# Patient Record
Sex: Male | Born: 1960 | Race: White | Hispanic: No | Marital: Married | Smoking: Never smoker
Health system: Southern US, Community
[De-identification: ages and names within clinical notes are randomized; demographics above are authoritative.]

## PROBLEM LIST (undated history)

## (undated) DIAGNOSIS — I10 Essential (primary) hypertension: Secondary | ICD-10-CM

## (undated) DIAGNOSIS — N200 Calculus of kidney: Secondary | ICD-10-CM

## (undated) HISTORY — PX: NO PAST SURGERIES: SHX2092

---

## 2006-11-09 ENCOUNTER — Ambulatory Visit: Payer: Self-pay | Admitting: Internal Medicine

## 2010-04-26 ENCOUNTER — Ambulatory Visit: Payer: Self-pay

## 2010-10-28 ENCOUNTER — Ambulatory Visit: Payer: Self-pay

## 2011-07-21 ENCOUNTER — Ambulatory Visit: Payer: Self-pay | Admitting: Internal Medicine

## 2014-08-28 ENCOUNTER — Encounter: Payer: Self-pay | Admitting: Emergency Medicine

## 2014-08-28 ENCOUNTER — Emergency Department: Payer: 59

## 2014-08-28 ENCOUNTER — Emergency Department
Admission: EM | Admit: 2014-08-28 | Discharge: 2014-08-28 | Disposition: A | Discharge status: Home / Self Care | Payer: 59 | Attending: Emergency Medicine | Admitting: Emergency Medicine

## 2014-08-28 DIAGNOSIS — R109 Unspecified abdominal pain: Secondary | ICD-10-CM | POA: Diagnosis present

## 2014-08-28 DIAGNOSIS — I10 Essential (primary) hypertension: Secondary | ICD-10-CM | POA: Insufficient documentation

## 2014-08-28 DIAGNOSIS — N200 Calculus of kidney: Secondary | ICD-10-CM | POA: Insufficient documentation

## 2014-08-28 DIAGNOSIS — Z79899 Other long term (current) drug therapy: Secondary | ICD-10-CM | POA: Insufficient documentation

## 2014-08-28 HISTORY — DX: Calculus of kidney: N20.0

## 2014-08-28 HISTORY — DX: Essential (primary) hypertension: I10

## 2014-08-28 LAB — CBC WITH DIFFERENTIAL/PLATELET
Basophils Absolute: 0.1 10*3/uL (ref 0–0.1)
Basophils Relative: 1 %
Eosinophils Absolute: 0.2 10*3/uL (ref 0–0.7)
Eosinophils Relative: 2 %
HCT: 46.8 % (ref 40.0–52.0)
Hemoglobin: 16 g/dL (ref 13.0–18.0)
Lymphocytes Relative: 31 %
Lymphs Abs: 2.8 10*3/uL (ref 1.0–3.6)
MCH: 32.9 pg (ref 26.0–34.0)
MCHC: 34.1 g/dL (ref 32.0–36.0)
MCV: 96.4 fL (ref 80.0–100.0)
Monocytes Absolute: 1.1 10*3/uL — ABNORMAL HIGH (ref 0.2–1.0)
Monocytes Relative: 11 %
Neutro Abs: 5.1 10*3/uL (ref 1.4–6.5)
Neutrophils Relative %: 55 %
Platelets: 185 10*3/uL (ref 150–440)
RBC: 4.85 MIL/uL (ref 4.40–5.90)
RDW: 13.7 % (ref 11.5–14.5)
WBC: 9.2 10*3/uL (ref 3.8–10.6)

## 2014-08-28 LAB — COMPREHENSIVE METABOLIC PANEL
ALK PHOS: 61 U/L (ref 38–126)
ALT: 85 U/L — ABNORMAL HIGH (ref 17–63)
AST: 49 U/L — AB (ref 15–41)
Albumin: 4.4 g/dL (ref 3.5–5.0)
Anion gap: 9 (ref 5–15)
BUN: 16 mg/dL (ref 6–20)
CHLORIDE: 101 mmol/L (ref 101–111)
CO2: 23 mmol/L (ref 22–32)
CREATININE: 1.06 mg/dL (ref 0.61–1.24)
Calcium: 8.3 mg/dL — ABNORMAL LOW (ref 8.9–10.3)
Glucose, Bld: 97 mg/dL (ref 65–99)
Potassium: 3.8 mmol/L (ref 3.5–5.1)
Sodium: 133 mmol/L — ABNORMAL LOW (ref 135–145)
TOTAL PROTEIN: 7.5 g/dL (ref 6.5–8.1)
Total Bilirubin: 0.4 mg/dL (ref 0.3–1.2)

## 2014-08-28 LAB — URINALYSIS COMPLETE WITH MICROSCOPIC (ARMC ONLY)
Bacteria, UA: NONE SEEN
Bilirubin Urine: NEGATIVE
Glucose, UA: NEGATIVE mg/dL
Ketones, ur: NEGATIVE mg/dL
Leukocytes, UA: NEGATIVE
Nitrite: NEGATIVE
Protein, ur: NEGATIVE mg/dL
Specific Gravity, Urine: 1.015 (ref 1.005–1.030)
Squamous Epithelial / HPF: NONE SEEN
WBC, UA: NONE SEEN WBC/hpf (ref 0–5)
pH: 5 (ref 5.0–8.0)

## 2014-08-28 LAB — LIPASE, BLOOD: Lipase: 64 U/L — ABNORMAL HIGH (ref 22–51)

## 2014-08-28 MED ORDER — TAMSULOSIN HCL 0.4 MG PO CAPS: 0.4000 mg | ORAL_CAPSULE | Freq: Every day | ORAL | Status: DC

## 2014-08-28 MED ORDER — OXYCODONE-ACETAMINOPHEN 5-325 MG PO TABS
1.0000 | ORAL_TABLET | ORAL | Status: AC
  Administered 2014-08-28: 1 via ORAL

## 2014-08-28 MED ORDER — OXYCODONE-ACETAMINOPHEN 5-325 MG PO TABS
ORAL_TABLET | ORAL | Status: AC
  Filled 2014-08-28: qty 1

## 2014-08-28 MED ORDER — ONDANSETRON 4 MG PO TBDP: 4.0000 mg | ORAL_TABLET | Freq: Four times a day (QID) | ORAL | Status: DC | PRN

## 2014-08-28 MED ORDER — HYDROMORPHONE HCL 1 MG/ML IJ SOLN
1.0000 mg | Freq: Once | INTRAMUSCULAR | Status: AC
Start: 2014-08-28 — End: 2014-08-28
  Administered 2014-08-28: 1 mg via INTRAVENOUS

## 2014-08-28 MED ORDER — OXYCODONE-ACETAMINOPHEN 5-325 MG PO TABS: 1.0000 | ORAL_TABLET | Freq: Four times a day (QID) | ORAL | Status: DC | PRN

## 2014-08-28 MED ORDER — HYDROMORPHONE HCL 1 MG/ML IJ SOLN
INTRAMUSCULAR | Status: AC
  Administered 2014-08-28: 1 mg via INTRAVENOUS
  Filled 2014-08-28: qty 1

## 2014-08-28 MED ORDER — IOHEXOL 350 MG/ML SOLN
125.0000 mL | Freq: Once | INTRAVENOUS | Status: AC | PRN
  Administered 2014-08-28: 125 mL via INTRAVENOUS

## 2014-08-28 MED ORDER — KETOROLAC TROMETHAMINE 30 MG/ML IJ SOLN
INTRAMUSCULAR | Status: AC
  Administered 2014-08-28: 30 mg via INTRAVENOUS
  Filled 2014-08-28: qty 1

## 2014-08-28 MED ORDER — KETOROLAC TROMETHAMINE 30 MG/ML IJ SOLN
30.0000 mg | Freq: Once | INTRAMUSCULAR | Status: AC
  Administered 2014-08-28: 30 mg via INTRAVENOUS

## 2014-08-28 NOTE — Discharge Instructions (Signed)
Kidney Stones  No driving tonight or while taking oxycodone. Return to the ER right away should you have a fever, increasing pain, pain that cannot be managed well at home, vomiting, chest pain or other concerns arise.  Kidney stones (urolithiasis) are deposits that form inside your kidneys. The intense pain is caused by the stone moving through the urinary tract. When the stone moves, the ureter goes into spasm around the stone. The stone is usually passed in the urine.  CAUSES   A disorder that makes certain neck glands produce too much parathyroid hormone (primary hyperparathyroidism).  A buildup of uric acid crystals, similar to gout in your joints.  Narrowing (stricture) of the ureter.  A kidney obstruction present at birth (congenital obstruction).  Previous surgery on the kidney or ureters.  Numerous kidney infections. SYMPTOMS   Feeling sick to your stomach (nauseous).  Throwing up (vomiting).  Blood in the urine (hematuria).  Pain that usually spreads (radiates) to the groin.  Frequency or urgency of urination. DIAGNOSIS   Taking a history and physical exam.  Blood or urine tests.  CT scan.  Occasionally, an examination of the inside of the urinary bladder (cystoscopy) is performed. TREATMENT   Observation.  Increasing your fluid intake.  Extracorporeal shock wave lithotripsy--This is a noninvasive procedure that uses shock waves to break up kidney stones.  Surgery may be needed if you have severe pain or persistent obstruction. There are various surgical procedures. Most of the procedures are performed with the use of small instruments. Only small incisions are needed to accommodate these instruments, so recovery time is minimized. The size, location, and chemical composition are all important variables that will determine the proper choice of action for you. Talk to your health care provider to better understand your situation so that you will minimize the  risk of injury to yourself and your kidney.  HOME CARE INSTRUCTIONS   Drink enough water and fluids to keep your urine clear or pale yellow. This will help you to pass the stone or stone fragments.  Strain all urine through the provided strainer. Keep all particulate matter and stones for your health care provider to see. The stone causing the pain may be as small as a grain of salt. It is very important to use the strainer each and every time you pass your urine. The collection of your stone will allow your health care provider to analyze it and verify that a stone has actually passed. The stone analysis will often identify what you can do to reduce the incidence of recurrences.  Only take over-the-counter or prescription medicines for pain, discomfort, or fever as directed by your health care provider.  Make a follow-up appointment with your health care provider as directed.  Get follow-up X-rays if required. The absence of pain does not always mean that the stone has passed. It may have only stopped moving. If the urine remains completely obstructed, it can cause loss of kidney function or even complete destruction of the kidney. It is your responsibility to make sure X-rays and follow-ups are completed. Ultrasounds of the kidney can show blockages and the status of the kidney. Ultrasounds are not associated with any radiation and can be performed easily in a matter of minutes. SEEK MEDICAL CARE IF:  You experience pain that is progressive and unresponsive to any pain medicine you have been prescribed. SEEK IMMEDIATE MEDICAL CARE IF:   Pain cannot be controlled with the prescribed medicine.  You have a fever  or shaking chills.  The severity or intensity of pain increases over 18 hours and is not relieved by pain medicine.  You develop a new onset of abdominal pain.  You feel faint or pass out.  You are unable to urinate. MAKE SURE YOU:   Understand these instructions.  Will watch  your condition.  Will get help right away if you are not doing well or get worse. Document Released: 02/11/2005 Document Revised: 10/14/2012 Document Reviewed: 07/15/2012 [facility] Patient Information 2015 [place], [place]. This information is not intended to replace advice given to you by your health care provider. Make sure you discuss any questions you have with your health care provider.

## 2014-08-28 NOTE — ED Provider Notes (Signed)
[facility] Emergency Department Provider Note  ____________________________________________  Time seen: Approximately 5:28 PM  I have reviewed the triage vital signs and the nursing notes.   HISTORY  Chief Complaint Abdominal Pain    HPI Ernest Tucker is a 54 y.o. male has a history of high blood pressure. He states that he was seeing [place] June 30 for same pain and they thought he may have passed a kidney stone, however states pain recurred today. He has not seen any blood in his urine though he was told he may have blood in his urine [place]. States he was not discharged with any pain medicine at that time.  Patient reports having severe left flank pain that radiates down towards the left groin but not into the groin or testicles. No nausea or vomiting. No chest pain or shortness of breath. Has not noticed any change in urination. Describes the pain as sharp and severe radiating from the left flank down towards the groin.   Past Medical History  Diagnosis Date  . Kidney stones   . Hypertension     There are no active problems to display for this patient.   History reviewed. No pertinent past surgical history.  Current Outpatient Rx  Name  Route  Sig  Dispense  Refill  . lisinopril (PRINIVIL,ZESTRIL) 10 MG tablet   Oral   Take 10 mg by mouth daily.         . ondansetron (ZOFRAN ODT) 4 MG disintegrating tablet   Oral   Take 1 tablet (4 mg total) by mouth every 6 (six) hours as needed for nausea or vomiting.   20 tablet   0   . oxyCODONE-acetaminophen (ROXICET) 5-325 MG per tablet   Oral   Take 1 tablet by mouth every 6 (six) hours as needed for severe pain.   20 tablet   0   . tamsulosin (FLOMAX) 0.4 MG CAPS capsule   Oral   Take 1 capsule (0.4 mg total) by mouth daily.   7 capsule   0     Allergies Review of patient's allergies indicates no known allergies.  Family History  Problem Relation Age of Onset  . COPD Mother   .  Heart attack Father     Social History History  Substance Use Topics  . Smoking status: Never Smoker   . Smokeless tobacco: Not on file  . Alcohol Use: 0.6 oz/week    1 Cans of beer per week    Review of Systems Constitutional: No fever/chills Eyes: No visual changes. ENT: No sore throat. Cardiovascular: Denies chest pain. Respiratory: Denies shortness of breath. Gastrointestinal: See history of present illness  No nausea, no vomiting.  No diarrhea.  No constipation. Genitourinary: Negative for dysuria. Musculoskeletal: Negative for back pain. Skin: Negative for rash. Neurological: Negative for headaches, focal weakness or numbness.  10-point ROS otherwise negative.  ____________________________________________   PHYSICAL EXAM:  VITAL SIGNS: ED Triage Vitals  Enc Vitals Group     BP 08/28/14 1537 136/97 mmHg     Pulse Rate 08/28/14 1537 81     Resp 08/28/14 1537 18     Temp 08/28/14 1537 97.5 F (36.4 C)     Temp Source 08/28/14 1537 Oral     SpO2 08/28/14 1537 97 %     Weight 08/28/14 1537 265 lb (120.203 kg)     Height 08/28/14 1537 5\' 9"  (1.753 m)     Head Cir --  Peak Flow --      Pain Score 08/28/14 1538 7     Pain Loc --      Pain Edu? --      Excl. in GC? --     Constitutional: Alert and oriented. Well appearing and does appear to be in some pain, holding his hand over his left flank. Eyes: Conjunctivae are normal. PERRL. EOMI. Head: Atraumatic. Nose: No congestion/rhinnorhea. Mouth/Throat: Mucous membranes are moist.  Oropharynx non-erythematous. Neck: No stridor.   Cardiovascular: Normal rate, regular rhythm. Grossly normal heart sounds.  Good peripheral circulation. Respiratory: Normal respiratory effort.  No retractions. Lungs CTAB. Gastrointestinal: Soft and nontender except for moderate tenderness along the left flank without rebound or guarding. No distention. No abdominal bruits. No CVA tenderness.  Musculoskeletal: No lower extremity  tenderness nor edema.  No joint effusions. Neurologic:  Normal speech and language. No gross focal neurologic deficits are appreciated. Speech is normal. No gait instability. Skin:  Skin is warm, dry and intact. No rash noted. Psychiatric: Mood and affect are normal. Speech and behavior are normal.  ____________________________________________   LABS (all labs ordered are listed, but only abnormal results are displayed)  Labs Reviewed  CBC WITH DIFFERENTIAL/PLATELET - Abnormal; Notable for the following:    Monocytes Absolute 1.1 (*)    All other components within normal limits  COMPREHENSIVE METABOLIC PANEL - Abnormal; Notable for the following:    Sodium 133 (*)    Calcium 8.3 (*)    AST 49 (*)    ALT 85 (*)    All other components within normal limits  URINALYSIS COMPLETEWITH MICROSCOPIC ([facility] ONLY) - Abnormal; Notable for the following:    Color, Urine YELLOW (*)    APPearance CLEAR (*)    Hgb urine dipstick 1+ (*)    All other components within normal limits  LIPASE, BLOOD - Abnormal; Notable for the following:    Lipase 64 (*)    All other components within normal limits   ____________________________________________  EKG  ED ECG REPORT I, Kalix Meinecke, [place], the attending physician, personally viewed and interpreted this ECG.  Date: 08/28/2014 EKG Time: 1840 Rate: 70 Rhythm: normal sinus rhythm QRS Axis: normal Intervals: normal ST/T Wave abnormalities: normal Conduction Disutrbances: First-degree AV block Narrative Interpretation: First-degree AV block otherwise normal EKG  ____________________________________________  RADIOLOGY  IMPRESSION: 1. No evidence of aortic dissection. No evidence of aneurysmal dilatation. Minimal calcification at the distal abdominal aorta; no significant calcific atherosclerotic disease seen. 2. Minimal left-sided hydronephrosis, and left-sided perinephric stranding, with an obstructing 5 x 4 mm stone noted distally at the left  vesicoureteral junction. 3. Mildly decreased left renal enhancement noted on delayed images; this could reflect mild acute pyelonephritis. Would correlate for associated symptoms. 4. Scattered diverticulosis along the transverse, descending and proximal sigmoid colon, without evidence of diverticulitis. 5. Small left inguinal hernia, containing only fat. ____________________________________________   PROCEDURES  Procedure(s) performed: None  Critical Care performed: No  ____________________________________________   INITIAL IMPRESSION / ASSESSMENT AND PLAN / ED COURSE  Pertinent labs & imaging results that were available during my care of the patient were reviewed by me and considered in my medical decision making (see chart for details).  Patient presents with recurring left flank pain. I was able to review his records from [place] and he did have a CT that does not show anything acute at that time, he did have some slight blood in his urine. Interestingly he did not have any kidney stones, and I  suppose it is quite possible that he has a small stone that was not radiographically evident however given the recurrence of this pain other etiologies are also considered including ischemic events and dissection given the patient's history of hypertension. I discussed with the patient, and we will treat his pain obtain repeat labs which most are done and appear fairly unremarkable except for some mild transaminitis. He has no right-sided pain and no right upper quadrant pain and I do not believe any acute right upper quadrant pathology.  Discussed risks and benefits of repeat CT including risks of radiation and died menstruation, but given the recurrence of his pain and shared medical decision making we will obtain a CT abdomen and pelvis with angiography to further evaluate.  No chest pain no cardiopulmonary symptoms.  ----------------------------------------- 8:31 PM on  08/28/2014 -----------------------------------------  She reports his pain is significantly improved. He is awake and alert in no distress. Urinalysis is negative for evidence of infection. He has no infectious symptoms. CT is reviewed.  I discussed with the patient treatment recommendations, return precautions, safe use of Percocet and that he may not drive today and should not drive truck or any motor car or operate heavy machinery while taking Percocet. He is agreeable. His wife will be driving him home.  We also discussed safe use of Percocet and never taking more than 2 tablets and a 6 hour period.  He will follow-up with urology. Careful return precautions advised. ____________________________________________   FINAL CLINICAL IMPRESSION(S) / ED DIAGNOSES  Final diagnoses:  Kidney stone on left side      Sharyn CreamerMark Maytal Mijangos, MD 08/28/14 2214

## 2014-08-28 NOTE — ED Notes (Signed)
Pt walked over from [facility], pt was seen on Thursday in [place] receiving a ct without contrast for the same pain, no kidney stone seen at that time, pt cont to have the left sided abd pain, flank pain, and pain in his left groin. Pt is holding his side and appears to be uncomfortable.

## 2015-07-13 ENCOUNTER — Other Ambulatory Visit: Payer: Self-pay | Admitting: Gastroenterology

## 2015-07-13 DIAGNOSIS — R945 Abnormal results of liver function studies: Principal | ICD-10-CM

## 2015-07-13 DIAGNOSIS — R7989 Other specified abnormal findings of blood chemistry: Secondary | ICD-10-CM

## 2015-07-18 ENCOUNTER — Ambulatory Visit: Payer: 59

## 2015-07-27 ENCOUNTER — Ambulatory Visit: Payer: 59

## 2017-12-23 ENCOUNTER — Other Ambulatory Visit: Payer: Self-pay | Admitting: Family Medicine

## 2017-12-23 DIAGNOSIS — R945 Abnormal results of liver function studies: Principal | ICD-10-CM

## 2017-12-23 DIAGNOSIS — R7989 Other specified abnormal findings of blood chemistry: Secondary | ICD-10-CM

## 2018-01-09 ENCOUNTER — Ambulatory Visit
Admission: RE | Admit: 2018-01-09 | Discharge: 2018-01-09 | Disposition: A | Discharge status: Home / Self Care | Payer: 59 | Source: Ambulatory Visit | Attending: Family Medicine | Admitting: Family Medicine

## 2018-01-09 DIAGNOSIS — R945 Abnormal results of liver function studies: Secondary | ICD-10-CM | POA: Insufficient documentation

## 2018-01-09 DIAGNOSIS — R7989 Other specified abnormal findings of blood chemistry: Secondary | ICD-10-CM

## 2018-01-11 DIAGNOSIS — E785 Hyperlipidemia, unspecified: Secondary | ICD-10-CM | POA: Insufficient documentation

## 2018-01-11 DIAGNOSIS — I10 Essential (primary) hypertension: Secondary | ICD-10-CM | POA: Insufficient documentation

## 2018-01-11 DIAGNOSIS — R7303 Prediabetes: Secondary | ICD-10-CM | POA: Insufficient documentation

## 2018-01-11 DIAGNOSIS — R600 Localized edema: Secondary | ICD-10-CM | POA: Insufficient documentation

## 2018-01-11 NOTE — Progress Notes (Signed)
Cardiology Office Note  Date:  01/13/2018   ID:  Ernest Tucker, DOB Apr 05, 1960, MRN [id number]  PCP:  Ernest Tucker, Ernest Tucker   Chief Complaint  Patient presents with  . other    Self ref for chest pain. Pt. has a cardiac Hx. at [facility]. Meds reviewed by the pt. verbally. Pt. c/o shortness of breath and chest pain for about 2-3 weeks. Pt. also c/o cramping in legs at night but not with walking.      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 spends entire work day with legs down posture Some chronic lower extremity edema  Right sternal chest pain 2 weeks ago "pulled muscle" Hurts to take a breath in, hurts to move his upper body rotating to the right Wanted to make sure it is not his heart Symptoms have resolved Denies any back pain  Previously Swelling felt possibly secondary to dependent edema, venous insufficiency  No prior echocardiogram on review of outside records  CT abdomen pelvis 2016 No significant plaque in the aorta or coronary calcification Images pulled up in the office today  Weight down 11 pounds by changing his diet  Lab work reviewed HBa1C 5.6 Total chol 197  EKG personally reviewed by myself on todays visit Shows normal sinus rhythm with rate 79 bpm no significant ST or T wave changes   PMH:   has a past medical history of Hypertension and Kidney stones.  PSH:   No past surgical history on file.  Current Outpatient Medications  Medication Sig Dispense Refill  . lisinopril (PRINIVIL,ZESTRIL) 10 MG tablet Take 10 mg by mouth daily.     No current facility-administered medications for this visit.       Allergies:   Other   Social History:  The patient  reports that he has never smoked. He has never used smokeless tobacco. He reports that he drinks about 1.0 standard drinks of alcohol per week. He reports that he does not use drugs.   Family History:   family history includes COPD in his mother; Heart attack in his father.    Review of Systems: Review of Systems  Constitutional: Negative.   Respiratory: Negative.   Cardiovascular: Negative.   Gastrointestinal: Negative.   Musculoskeletal: Negative.        Pain, right of the sternum  Neurological: Negative.   Psychiatric/Behavioral: Negative.   All other systems reviewed and are negative.    PHYSICAL EXAM: VS:  BP 130/90 (BP Location: Right Arm, Patient Position: Sitting, Cuff Size: Normal)   Pulse 79   Ht 5\' 9"  (1.753 m)   Wt 260 lb 8 oz (118.2 kg)   SpO2 98%   BMI 38.47 kg/m  , BMI Body mass index is 38.47 kg/m. GEN: Well nourished, well developed, in no acute distress , obese HEENT: normal  Neck: no JVD, carotid bruits, or masses Cardiac: RRR; no murmurs, rubs, or gallops,no edema  Respiratory:  clear to auscultation bilaterally, normal work of breathing GI: soft, nontender, nondistended, + BS MS: no deformity or atrophy  Skin: warm and dry, no rash Neuro:  Strength and sensation are intact Psych: euthymic mood, full affect   Recent  Labs: No results found for requested labs within last 8760 hours.    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 No changes made to the medications. Numbers should improve more with weight loss  Disposition:   F/U as needed   Total encounter time more than 45 minutes  Greater than 50% was spent in counseling and coordination of care with the patient    Orders Placed This Encounter  Procedures  . EKG 12-Lead     Signed, Dossie Arbour, M.D., Ph.D. 01/13/2018  [facility] [place], [place] [phone]

## 2018-01-13 ENCOUNTER — Ambulatory Visit (INDEPENDENT_AMBULATORY_CARE_PROVIDER_SITE_OTHER): Payer: 59 | Admitting: Cardiovascular Disease

## 2018-01-13 ENCOUNTER — Encounter: Payer: Self-pay | Admitting: Cardiovascular Disease

## 2018-01-13 VITALS — BP 130/90 | HR 79 | Ht 69.0 in | Wt 260.5 lb

## 2018-01-13 DIAGNOSIS — R079 Chest pain, unspecified: Secondary | ICD-10-CM

## 2018-01-13 DIAGNOSIS — R7303 Prediabetes: Secondary | ICD-10-CM | POA: Diagnosis not present

## 2018-01-13 DIAGNOSIS — R600 Localized edema: Secondary | ICD-10-CM

## 2018-01-13 DIAGNOSIS — E785 Hyperlipidemia, unspecified: Secondary | ICD-10-CM

## 2018-01-13 DIAGNOSIS — I10 Essential (primary) hypertension: Secondary | ICD-10-CM

## 2018-01-13 NOTE — Patient Instructions (Signed)

## 2019-02-03 ENCOUNTER — Other Ambulatory Visit: Payer: Self-pay

## 2019-02-03 ENCOUNTER — Ambulatory Visit
Admission: EM | Admit: 2019-02-03 | Discharge: 2019-02-03 | Disposition: A | Discharge status: Home / Self Care | Payer: Self-pay | Attending: Family Medicine | Admitting: Family Medicine

## 2019-02-03 ENCOUNTER — Encounter: Payer: Self-pay | Admitting: Emergency Medicine

## 2019-02-03 ENCOUNTER — Ambulatory Visit (INDEPENDENT_AMBULATORY_CARE_PROVIDER_SITE_OTHER): Payer: Self-pay

## 2019-02-03 DIAGNOSIS — M25562 Pain in left knee: Secondary | ICD-10-CM

## 2019-02-03 DIAGNOSIS — M25462 Effusion, left knee: Secondary | ICD-10-CM

## 2019-02-03 MED ORDER — MELOXICAM 15 MG PO TABS: 15.0000 mg | ORAL_TABLET | Freq: Every day | ORAL | 0 refills | Status: DC | PRN

## 2019-02-03 NOTE — ED Triage Notes (Signed)
Patient in today c/o left knee pain x 1-2 weeks. Patient unsure of any injury. Patient does state that he stepped in a hole yesterday and that has made it worse.

## 2019-02-03 NOTE — Discharge Instructions (Signed)
Rest, ice, elevation.  Medication as needed.  If persists, see Ortho

## 2019-02-04 NOTE — ED Provider Notes (Addendum)
[facility]    CSN: [id number] Arrival date & time: 02/03/19  1821  History   Chief Complaint Chief Complaint  Patient presents with  . Knee Pain    left   HPI  58 year old male presents with the above complaints.  Patient reports 1 to 2 week history of left knee pain.  Denies fall, trauma, injury.  Patient does report that he "stepped in a hole" yesterday.  Seems to be worse since that time.  Reports swelling and severe pain, 9/10 in severity.  Worse with activity.  No relieving factors.  He is taken some over-the-counter medication without resolution.  No other reported symptoms.  No other complaints.  PMH, Surgical Hx, Family Hx, Social History reviewed and updated as below.  PMH: Obesity    HLD (hyperlipidemia)    HTN (hypertension)    Nephrolithiasis 01/31/2011   Erectile dysfunction 01/31/2011   Elevated LFTs      Past Surgical History:  Procedure Laterality Date  . NO PAST SURGERIES     Home Medications    Prior to Admission medications   Medication Sig Start Date End Date Taking? Authorizing Provider  aspirin EC 81 MG tablet Take 81 mg by mouth daily.   Yes [provider]  lisinopril (PRINIVIL,ZESTRIL) 10 MG tablet Take 10 mg by mouth daily.   Yes [provider]  Multiple Vitamin (MULTIVITAMIN) tablet Take 1 tablet by mouth daily.   Yes [provider]  meloxicam (MOBIC) 15 MG tablet Take 1 tablet (15 mg total) by mouth daily as needed for pain. 02/03/19   Tommie Sams, DO    Family History Family History  Problem Relation Age of Onset  . COPD Mother   . Heart attack Father     Social History Social History   Tobacco Use  . Smoking status: Never Smoker  . Smokeless tobacco: Never Used  Substance Use Topics  . Alcohol use: Yes    Alcohol/week: 3.0 standard drinks    Types: 3 Cans of beer per week    Comment: on the weekends  . Drug use: No     Allergies   Other   Review of Systems Review of  Systems  Constitutional: Negative.   Musculoskeletal:       Left knee pain & swelling.   Physical Exam Triage Vital Signs ED Triage Vitals  Enc Vitals Group     BP 02/03/19 1839 (!) 136/102     Pulse Rate 02/03/19 1839 80     Resp 02/03/19 1839 18     Temp 02/03/19 1839 98.4 F (36.9 C)     Temp Source 02/03/19 1839 Oral     SpO2 02/03/19 1839 97 %     Weight 02/03/19 1835 252 lb (114.3 kg)     Height 02/03/19 1835 5\' 9"  (1.753 m)     Head Circumference --      Peak Flow --      Pain Score 02/03/19 1835 9     Pain Loc --      Pain Edu? --      Excl. in GC? --    Updated Vital Signs BP (!) 143/99 (BP Location: Right Arm)   Pulse 80   Temp 98.4 F (36.9 C) (Oral)   Resp 18   Ht 5\' 9"  (1.753 m)   Wt 114.3 kg   SpO2 97%   BMI 37.21 kg/m   Visual Acuity Right Eye Distance:   Left Eye Distance:  Bilateral Distance:    Right Eye Near:   Left Eye Near:    Bilateral Near:     Physical Exam Constitutional:      General: He is not in acute distress.    Appearance: Normal appearance. He is obese. He is not ill-appearing.  HENT:     Head: Normocephalic and atraumatic.  Eyes:     General:        Right eye: No discharge.        Left eye: No discharge.     Conjunctiva/sclera: Conjunctivae normal.  Cardiovascular:     Rate and Rhythm: Normal rate and regular rhythm.     Heart sounds: No murmur.  Pulmonary:     Effort: Pulmonary effort is normal.     Breath sounds: Normal breath sounds. No wheezing, rhonchi or rales.  Musculoskeletal:     Comments: Left knee -mild anterior joint line tenderness.  Decreased range of motion in extension.  Mild crepitus with extension of knee.  Skin:    General: Skin is warm.     Findings: No rash.  Neurological:     Mental Status: He is alert.    UC Treatments / Results  Labs (all labs ordered are listed, but only abnormal results are displayed) Labs Reviewed - No data to display  EKG   Radiology DG Knee Complete 4 Views  Left  Result Date: 02/03/2019 CLINICAL DATA:  Recent fall today with knee pain, initial encounter EXAM: LEFT KNEE - COMPLETE 4+ VIEW COMPARISON:  None. FINDINGS: No acute fracture or dislocation is noted. Mild medial joint space narrowing is seen. Patellofemoral degenerative changes and small joint effusion are noted as well. No other focal abnormality is seen. IMPRESSION: Degenerative change with small joint effusion. Electronically Signed   By: Inez Catalina M.D.   On: 02/03/2019 19:15    Procedures Procedures (including critical care time)  Medications Ordered in UC Medications - No data to display  Initial Impression / Assessment and Plan / UC Course  I have reviewed the triage vital signs and the nursing notes.  Pertinent labs & imaging results that were available during my care of the patient were reviewed by me and considered in my medical decision making (see chart for details).    58 year old male presents with acute left knee pain.  X-ray with degenerative changes and effusion.  Advised rest, ice, elevation.  Meloxicam as directed.  Supportive care.  Final Clinical Impressions(s) / UC Diagnoses   Final diagnoses:  Acute pain of left knee  Effusion of left knee     Discharge Instructions     Rest, ice, elevation.  Medication as needed.  If persists, see Ortho   ED Prescriptions    Medication Sig Dispense Auth. Provider   meloxicam (MOBIC) 15 MG tablet Take 1 tablet (15 mg total) by mouth daily as needed for pain. 30 tablet Coral Spikes, DO     PDMP not reviewed this encounter.   Coral Spikes, DO 02/04/19 1049    Coral Spikes, DO 02/04/19 1056

## 2019-05-27 ENCOUNTER — Other Ambulatory Visit: Payer: Self-pay | Admitting: Orthopedic Surgery

## 2019-05-27 DIAGNOSIS — M25569 Pain in unspecified knee: Secondary | ICD-10-CM

## 2019-05-27 NOTE — Progress Notes (Signed)
Bilateral standing hip to ankle x-rays on one cassette for evaluation of alignment 

## 2019-06-03 ENCOUNTER — Ambulatory Visit
Admission: RE | Admit: 2019-06-03 | Discharge: 2019-06-03 | Disposition: A | Discharge status: Home / Self Care | Payer: Managed Care, Other (non HMO) | Source: Ambulatory Visit | Attending: Orthopedic Surgery | Admitting: Orthopedic Surgery

## 2019-06-03 DIAGNOSIS — M25569 Pain in unspecified knee: Secondary | ICD-10-CM | POA: Insufficient documentation

## 2019-09-26 ENCOUNTER — Ambulatory Visit (INDEPENDENT_AMBULATORY_CARE_PROVIDER_SITE_OTHER): Payer: Managed Care, Other (non HMO)

## 2019-09-26 ENCOUNTER — Ambulatory Visit
Admission: EM | Admit: 2019-09-26 | Discharge: 2019-09-26 | Disposition: A | Discharge status: Home / Self Care | Payer: Managed Care, Other (non HMO) | Attending: Family Medicine | Admitting: Family Medicine

## 2019-09-26 ENCOUNTER — Encounter: Payer: Self-pay | Admitting: Emergency Medicine

## 2019-09-26 ENCOUNTER — Other Ambulatory Visit: Payer: Self-pay

## 2019-09-26 DIAGNOSIS — M25532 Pain in left wrist: Secondary | ICD-10-CM

## 2019-09-26 MED ORDER — KETOROLAC TROMETHAMINE 10 MG PO TABS: 10.0000 mg | ORAL_TABLET | Freq: Four times a day (QID) | ORAL | 0 refills | Status: DC | PRN

## 2019-09-26 NOTE — ED Triage Notes (Signed)
Patient states that he was moving furniture yesterday.  Patient c/o left wrist pain.

## 2019-09-26 NOTE — Discharge Instructions (Signed)
Rest, ice, compression, elevation.  Medication as directed.  Take care  Dr. Adriana Simas

## 2019-09-26 NOTE — ED Provider Notes (Signed)
[facility]    CSN: [id number] Arrival date & time: 09/26/19  0820      History   Chief Complaint Chief Complaint  Patient presents with  . Wrist Pain    left   HPI  59 year old male presents with left wrist pain.  Left wrist pain   Started yesterday.   No reported injury or fall. He does note that he was lifting furniture.  Diffuse wrist pain.  9/10 in severity.  Patient states that it is better when he squeezes wrist.  Seems to be exacerbated by range of motion.  No other associated symptoms.  Past Medical History:  Diagnosis Date  . Hypertension   . Kidney stones    Patient Active Problem List   Diagnosis Date Noted  . Chest pain with moderate risk for cardiac etiology 01/13/2018  . Leg edema 01/11/2018  . Hyperlipidemia 01/11/2018  . Prediabetes 01/11/2018  . Essential hypertension 01/11/2018   Past Surgical History:  Procedure Laterality Date  . NO PAST SURGERIES      Home Medications    Prior to Admission medications   Medication Sig Start Date End Date Taking? Authorizing Provider  aspirin EC 81 MG tablet Take 81 mg by mouth daily.   Yes [provider]  lisinopril (PRINIVIL,ZESTRIL) 10 MG tablet Take 10 mg by mouth daily.   Yes [provider]  Multiple Vitamin (MULTIVITAMIN) tablet Take 1 tablet by mouth daily.   Yes [provider]  ketorolac (TORADOL) 10 MG tablet Take 1 tablet (10 mg total) by mouth every 6 (six) hours as needed for moderate pain or severe pain. 09/26/19   Tommie Sams, DO    Family History Family History  Problem Relation Age of Onset  . COPD Mother   . Heart attack Father     Social History Social History   Tobacco Use  . Smoking status: Never Smoker  . Smokeless tobacco: Never Used  Vaping Use  . Vaping Use: Never used  Substance Use Topics  . Alcohol use: Yes    Alcohol/week: 3.0 standard drinks    Types: 3 Cans of beer per week    Comment: on the weekends  . Drug  use: No     Allergies   Other   Review of Systems Review of Systems  Constitutional: Negative.   Musculoskeletal:       Left wrist pain.   Physical Exam Triage Vital Signs ED Triage Vitals  Enc Vitals Group     BP 09/26/19 0834 (!) 135/88     Pulse Rate 09/26/19 0834 83     Resp 09/26/19 0834 16     Temp 09/26/19 0834 98.4 F (36.9 C)     Temp Source 09/26/19 0834 Oral     SpO2 09/26/19 0834 98 %     Weight 09/26/19 0833 (!) 250 lb (113.4 kg)     Height 09/26/19 0833 5\' 9"  (1.753 m)     Head Circumference --      Peak Flow --      Pain Score 09/26/19 0832 9     Pain Loc --      Pain Edu? --      Excl. in GC? --    Updated Vital Signs BP (!) 135/88 (BP Location: Left Arm)   Pulse 83   Temp 98.4 F (36.9 C) (Oral)   Resp 16   Ht 5\' 9"  (1.753 m)   Wt (!) 113.4 kg  SpO2 98%   BMI 36.92 kg/m   Visual Acuity Right Eye Distance:   Left Eye Distance:   Bilateral Distance:    Right Eye Near:   Left Eye Near:    Bilateral Near:     Physical Exam Constitutional:      General: He is not in acute distress.    Appearance: Normal appearance. He is obese. He is not ill-appearing.  HENT:     Head: Normocephalic and atraumatic.  Pulmonary:     Effort: Pulmonary effort is normal. No respiratory distress.  Musculoskeletal:     Comments: Left wrist -diffusely tender to palpation.  Skin:    General: Skin is warm.     Findings: No rash.  Neurological:     Mental Status: He is alert.  Psychiatric:        Mood and Affect: Mood normal.        Behavior: Behavior normal.    UC Treatments / Results  Labs (all labs ordered are listed, but only abnormal results are displayed) Labs Reviewed - No data to display  EKG   Radiology DG Wrist Complete Left  Result Date: 09/26/2019 CLINICAL DATA:  Progressive LEFT wrist pain since yesterday after lifting heavy furniture. No known injury. EXAM: LEFT WRIST - COMPLETE 3+ VIEW COMPARISON:  10/28/2010. FINDINGS: No evidence  of acute fracture or dislocation. Well-preserved joint spaces. Cystic changes in the lunate and possibly also the triquetrum. Joint effusion suspected. Calcified loose bodies in the joint. IMPRESSION: 1. No acute osseous abnormality. 2. Cystic changes in the lunate and possibly also the triquetrum. 3. Joint effusion suspected. Calcified loose bodies within the joint. Electronically Signed   By: Hulan Saas M.D.   On: 09/26/2019 09:13    Procedures Procedures (including critical care time)  Medications Ordered in UC Medications - No data to display  Initial Impression / Assessment and Plan / UC Course  I have reviewed the triage vital signs and the nursing notes.  Pertinent labs & imaging results that were available during my care of the patient were reviewed by me and considered in my medical decision making (see chart for details).    59 year old male presents with acute left wrist pain.  X-ray obtained today and independently reviewed by me.  Impression: No acute fracture.  There are some cystic changes of the wrist which are likely degenerative.  There are some loose bodies as well.  Toradol as needed for pain.  Advise rest, ice, compression, elevation.  Final Clinical Impressions(s) / UC Diagnoses   Final diagnoses:  Left wrist pain     Discharge Instructions     Rest, ice, compression, elevation.  Medication as directed.  Take care  Dr. Adriana Simas     ED Prescriptions    Medication Sig Dispense Auth. Provider   ketorolac (TORADOL) 10 MG tablet Take 1 tablet (10 mg total) by mouth every 6 (six) hours as needed for moderate pain or severe pain. 20 tablet Tommie Sams, DO     PDMP not reviewed this encounter.   Everlene Other [place], [place] 09/26/19 [phone]

## 2020-09-25 ENCOUNTER — Other Ambulatory Visit: Payer: Self-pay | Admitting: Family Medicine

## 2020-09-25 DIAGNOSIS — R7989 Other specified abnormal findings of blood chemistry: Secondary | ICD-10-CM

## 2020-10-05 ENCOUNTER — Other Ambulatory Visit: Payer: Self-pay

## 2020-10-05 ENCOUNTER — Ambulatory Visit
Admission: RE | Admit: 2020-10-05 | Discharge: 2020-10-05 | Disposition: A | Discharge status: Home / Self Care | Payer: Managed Care, Other (non HMO) | Source: Ambulatory Visit | Attending: Family Medicine | Admitting: Family Medicine

## 2020-10-05 DIAGNOSIS — R7989 Other specified abnormal findings of blood chemistry: Secondary | ICD-10-CM | POA: Diagnosis not present

## 2020-10-10 ENCOUNTER — Ambulatory Visit
Admission: EM | Admit: 2020-10-10 | Discharge: 2020-10-10 | Disposition: A | Discharge status: Home / Self Care | Payer: Managed Care, Other (non HMO) | Attending: Family Medicine | Admitting: Family Medicine

## 2020-10-10 ENCOUNTER — Other Ambulatory Visit: Payer: Self-pay

## 2020-10-10 DIAGNOSIS — L02212 Cutaneous abscess of back [any part, except buttock]: Secondary | ICD-10-CM

## 2020-10-10 MED ORDER — CEFTRIAXONE SODIUM 1 G IJ SOLR
1.0000 g | Freq: Once | INTRAMUSCULAR | Status: AC
  Administered 2020-10-10: 1 g via INTRAMUSCULAR

## 2020-10-10 MED ORDER — TRAMADOL HCL 50 MG PO TABS: 50.0000 mg | ORAL_TABLET | Freq: Three times a day (TID) | ORAL | 0 refills | Status: DC | PRN

## 2020-10-10 MED ORDER — SULFAMETHOXAZOLE-TRIMETHOPRIM 800-160 MG PO TABS: 1.0000 | ORAL_TABLET | Freq: Two times a day (BID) | ORAL | 0 refills | Status: DC

## 2020-10-10 NOTE — Discharge Instructions (Addendum)
Medication as prescribed.  If you continue to have fever or the area worsens, please go to the hospital.  Change dressing daily.  Warm soaks.  Take care  Dr. Adriana Simas

## 2020-10-10 NOTE — ED Triage Notes (Signed)
Pt c/o possible abscess on his left lower back. Pt noticed a bump there yesterday. Pt states the area is red and very painful. Pt also reports chills and fever (101.5) earlier today.

## 2020-10-11 DIAGNOSIS — L02212 Cutaneous abscess of back [any part, except buttock]: Secondary | ICD-10-CM | POA: Diagnosis not present

## 2020-10-11 NOTE — ED Provider Notes (Signed)
[facility]    CSN: [id number] Arrival date & time: 10/10/20  1922      History   Chief Complaint Chief Complaint  Patient presents with   Abscess   Fever    HPI 60 year old male presents with the above complaints.  Patient reports that he noticed a bump on his left low back yesterday.  He squeezed it and attempted to drain it.  He has since worsened.  He has had a fever as of today, T-max 101.5.  Associated chills.  He took Tylenol with improvement in fever.  He states that the area on his left low back is very tender and painful.  It is 10/10 in severity.  No relieving factors.  No known inciting factor.  Past Medical History:  Diagnosis Date   Hypertension    Kidney stones     Patient Active Problem List   Diagnosis Date Noted   Chest pain with moderate risk for cardiac etiology 01/13/2018   Leg edema 01/11/2018   Hyperlipidemia 01/11/2018   Prediabetes 01/11/2018   Essential hypertension 01/11/2018    Past Surgical History:  Procedure Laterality Date   NO PAST SURGERIES         Home Medications    Prior to Admission medications   Medication Sig Start Date End Date Taking? Authorizing Provider  aspirin EC 81 MG tablet Take 81 mg by mouth daily.   Yes [provider]  atorvastatin (LIPITOR) 10 MG tablet Take 10 mg by mouth daily. 09/19/20  Yes [provider]  citalopram (CELEXA) 20 MG tablet Take 20 mg by mouth daily. 09/19/20  Yes [provider]  lisinopril (PRINIVIL,ZESTRIL) 10 MG tablet Take 10 mg by mouth daily.   Yes [provider]  Multiple Vitamin (MULTIVITAMIN) tablet Take 1 tablet by mouth daily.   Yes [provider]  traMADol (ULTRAM) 50 MG tablet Take 1 tablet (50 mg total) by mouth every 8 (eight) hours as needed for severe pain or moderate pain. 10/10/20  Yes Becki Mccaskill G, DO  sulfamethoxazole-trimethoprim (BACTRIM DS) 800-160 MG tablet Take 1 tablet by mouth 2 (two) times daily for 7  days. 10/10/20 10/17/20  Tommie Sams, DO    Family History Family History  Problem Relation Age of Onset   COPD Mother    Heart attack Father     Social History Social History   Tobacco Use   Smoking status: Never   Smokeless tobacco: Never  Vaping Use   Vaping Use: Never used  Substance Use Topics   Alcohol use: Yes    Alcohol/week: 3.0 standard drinks    Types: 3 Cans of beer per week    Comment: on the weekends   Drug use: No     Allergies   Other   Review of Systems Review of Systems  Constitutional:  Positive for fever.  Skin:        Abscess.   Physical Exam Triage Vital Signs ED Triage Vitals  Enc Vitals Group     BP 10/10/20 1946 (!) 126/94     Pulse Rate 10/10/20 1946 (!) 107     Resp 10/10/20 1946 18     Temp 10/10/20 1946 99 F (37.2 C)     Temp Source 10/10/20 1946 Oral     SpO2 10/10/20 1946 96 %     Weight 10/10/20 1943 250 lb (113.4 kg)     Height 10/10/20 1943 5\' 8"  (1.727 m)     Head Circumference --  Peak Flow --      Pain Score 10/10/20 1943 10     Pain Loc --      Pain Edu? --      Excl. in GC? --    Updated Vital Signs BP (!) 126/94 (BP Location: Left Arm)   Pulse (!) 107   Temp 99 F (37.2 C) (Oral)   Resp 18   Ht 5\' 8"  (1.727 m)   Wt 113.4 kg   SpO2 96%   BMI 38.01 kg/m   Visual Acuity Right Eye Distance:   Left Eye Distance:   Bilateral Distance:    Right Eye Near:   Left Eye Near:    Bilateral Near:     Physical Exam Constitutional:      General: He is not in acute distress.    Comments: Appears mildly ill.   HENT:     Head: Normocephalic and atraumatic.  Eyes:     General:        Right eye: No discharge.        Left eye: No discharge.     Conjunctiva/sclera: Conjunctivae normal.  Cardiovascular:     Rate and Rhythm: Regular rhythm. Tachycardia present.  Pulmonary:     Effort: Pulmonary effort is normal.     Breath sounds: Normal breath sounds.  Skin:         Comments: Large abscess with  extensive induration and surrounding erythema.  Exquisitely tender to palpation.  Neurological:     Mental Status: He is alert.  Psychiatric:        Mood and Affect: Mood normal.        Behavior: Behavior normal.     UC Treatments / Results  Labs (all labs ordered are listed, but only abnormal results are displayed) Labs Reviewed - No data to display  EKG   Radiology No results found.  Procedures Incision and Drainage  Date/Time: 10/11/2020 12:02 PM Performed by: 10/13/2020, DO Authorized by: Tommie Sams, DO   Consent:    Consent obtained:  Verbal   Consent given by:  Patient Location:    Type:  Abscess   Location:  Trunk   Trunk location:  Back Pre-procedure details:    Skin preparation:  Povidone-iodine Anesthesia:    Anesthesia method:  Local infiltration   Local anesthetic:  Lidocaine 1% WITH epi Procedure type:    Complexity:  Simple Procedure details:    Incision types:  Stab incision   Drainage:  Bloody and purulent   Drainage amount:  Moderate   Wound treatment:  Wound left open   Packing materials:  None Post-procedure details:    Procedure completion:  Tolerated well, no immediate complications (including critical care time)  Medications Ordered in UC Medications  cefTRIAXone (ROCEPHIN) injection 1 g (1 g Intramuscular Given 10/10/20 2020)    Initial Impression / Assessment and Plan / UC Course  I have reviewed the triage vital signs and the nursing notes.  Pertinent labs & imaging results that were available during my care of the patient were reviewed by me and considered in my medical decision making (see chart for details).    60 year old male presents with a large abscess with extensive induration to the left side of the low back.  Incision and drainage was performed.  Some bloody and purulent fluid was exuded from the abscess.  He still has quite extensive induration.  We discussed going to the hospital given his recent fever and patient  refused.  IM Rocephin was given.  Sending home on Bactrim.  Tramadol as needed for pain.  Advised patient and his wife that if he fails to improve or worsens, he needs to go immediately to the emergency room.  They are in agreement with the plan.  Final Clinical Impressions(s) / UC Diagnoses   Final diagnoses:  Abscess of back     Discharge Instructions      Medication as prescribed.  If you continue to have fever or the area worsens, please go to the hospital.  Change dressing daily.  Warm soaks.  Take care  Dr. Adriana Simas    ED Prescriptions     Medication Sig Dispense Auth. Provider   sulfamethoxazole-trimethoprim (BACTRIM DS) 800-160 MG tablet  (Status: Discontinued) Take 1 tablet by mouth 2 (two) times daily for 7 days. 20 tablet Habiba Treloar G, DO   traMADol (ULTRAM) 50 MG tablet Take 1 tablet (50 mg total) by mouth every 8 (eight) hours as needed for severe pain or moderate pain. 15 tablet Parris Signer G, DO   sulfamethoxazole-trimethoprim (BACTRIM DS) 800-160 MG tablet Take 1 tablet by mouth 2 (two) times daily for 7 days. 20 tablet Everlene Other G, DO      I have reviewed the PDMP during this encounter.   Tommie Sams, [place] 10/11/20 1208

## 2020-10-13 ENCOUNTER — Inpatient Hospital Stay
Admission: EM | Admit: 2020-10-13 | Discharge: 2020-10-15 | DRG: 603 | Disposition: A | Discharge status: Home / Self Care | Payer: Managed Care, Other (non HMO) | Attending: Internal Medicine | Admitting: Internal Medicine

## 2020-10-13 ENCOUNTER — Encounter: Payer: Self-pay | Admitting: Emergency Medicine

## 2020-10-13 ENCOUNTER — Other Ambulatory Visit: Payer: Self-pay

## 2020-10-13 ENCOUNTER — Observation Stay: Payer: Managed Care, Other (non HMO)

## 2020-10-13 DIAGNOSIS — I10 Essential (primary) hypertension: Secondary | ICD-10-CM | POA: Diagnosis present

## 2020-10-13 DIAGNOSIS — R234 Changes in skin texture: Secondary | ICD-10-CM | POA: Diagnosis present

## 2020-10-13 DIAGNOSIS — Z825 Family history of asthma and other chronic lower respiratory diseases: Secondary | ICD-10-CM | POA: Diagnosis not present

## 2020-10-13 DIAGNOSIS — Z20822 Contact with and (suspected) exposure to covid-19: Secondary | ICD-10-CM | POA: Diagnosis present

## 2020-10-13 DIAGNOSIS — F32A Depression, unspecified: Secondary | ICD-10-CM | POA: Diagnosis present

## 2020-10-13 DIAGNOSIS — L03311 Cellulitis of abdominal wall: Secondary | ICD-10-CM | POA: Diagnosis present

## 2020-10-13 DIAGNOSIS — Z7982 Long term (current) use of aspirin: Secondary | ICD-10-CM

## 2020-10-13 DIAGNOSIS — L02212 Cutaneous abscess of back [any part, except buttock]: Secondary | ICD-10-CM | POA: Diagnosis present

## 2020-10-13 DIAGNOSIS — Z79899 Other long term (current) drug therapy: Secondary | ICD-10-CM

## 2020-10-13 DIAGNOSIS — L03312 Cellulitis of back [any part except buttock]: Secondary | ICD-10-CM | POA: Diagnosis present

## 2020-10-13 DIAGNOSIS — A419 Sepsis, unspecified organism: Secondary | ICD-10-CM

## 2020-10-13 DIAGNOSIS — L02219 Cutaneous abscess of trunk, unspecified: Secondary | ICD-10-CM

## 2020-10-13 DIAGNOSIS — Z8249 Family history of ischemic heart disease and other diseases of the circulatory system: Secondary | ICD-10-CM | POA: Diagnosis not present

## 2020-10-13 DIAGNOSIS — R7303 Prediabetes: Secondary | ICD-10-CM | POA: Diagnosis present

## 2020-10-13 DIAGNOSIS — L03319 Cellulitis of trunk, unspecified: Secondary | ICD-10-CM | POA: Diagnosis present

## 2020-10-13 LAB — URINALYSIS, COMPLETE (UACMP) WITH MICROSCOPIC
Bilirubin Urine: NEGATIVE
Glucose, UA: NEGATIVE mg/dL
Ketones, ur: 20 mg/dL — AB
Leukocytes,Ua: NEGATIVE
Nitrite: NEGATIVE
Protein, ur: 30 mg/dL — AB
Specific Gravity, Urine: 1.032 — ABNORMAL HIGH (ref 1.005–1.030)
pH: 5 (ref 5.0–8.0)

## 2020-10-13 LAB — COMPREHENSIVE METABOLIC PANEL
ALT: 27 U/L (ref 0–44)
AST: 18 U/L (ref 15–41)
Albumin: 3.5 g/dL (ref 3.5–5.0)
Alkaline Phosphatase: 59 U/L (ref 38–126)
Anion gap: 10 (ref 5–15)
BUN: 18 mg/dL (ref 6–20)
CO2: 21 mmol/L — ABNORMAL LOW (ref 22–32)
Calcium: 8.8 mg/dL — ABNORMAL LOW (ref 8.9–10.3)
Chloride: 101 mmol/L (ref 98–111)
Creatinine, Ser: 1.04 mg/dL (ref 0.61–1.24)
GFR, Estimated: >60 mL/min (ref >60)
Glucose, Bld: 140 mg/dL — ABNORMAL HIGH (ref 70–99)
Potassium: 3.8 mmol/L (ref 3.5–5.1)
Sodium: 132 mmol/L — ABNORMAL LOW (ref 135–145)
Total Bilirubin: 1 mg/dL (ref 0.3–1.2)
Total Protein: 7.9 g/dL (ref 6.5–8.1)

## 2020-10-13 LAB — CBC WITH DIFFERENTIAL/PLATELET
Abs Immature Granulocytes: 0.12 10*3/uL — ABNORMAL HIGH (ref 0.00–0.07)
Basophils Absolute: 0.1 10*3/uL (ref 0.0–0.1)
Basophils Relative: 0 %
Eosinophils Absolute: 0.1 10*3/uL (ref 0.0–0.5)
Eosinophils Relative: 0 %
HCT: 45.9 % (ref 39.0–52.0)
Hemoglobin: 16 g/dL (ref 13.0–17.0)
Immature Granulocytes: 1 %
Lymphocytes Relative: 11 %
Lymphs Abs: 1.8 10*3/uL (ref 0.7–4.0)
MCH: 32.3 pg (ref 26.0–34.0)
MCHC: 34.9 g/dL (ref 30.0–36.0)
MCV: 92.7 fL (ref 80.0–100.0)
Monocytes Absolute: 1.5 10*3/uL — ABNORMAL HIGH (ref 0.1–1.0)
Monocytes Relative: 9 %
Neutro Abs: 12.5 10*3/uL — ABNORMAL HIGH (ref 1.7–7.7)
Neutrophils Relative %: 79 %
Platelets: 211 10*3/uL (ref 150–400)
RBC: 4.95 MIL/uL (ref 4.22–5.81)
RDW: 12.5 % (ref 11.5–15.5)
WBC: 16 10*3/uL — ABNORMAL HIGH (ref 4.0–10.5)
nRBC: 0 % (ref 0.0–0.2)

## 2020-10-13 LAB — RESP PANEL BY RT-PCR (FLU A&B, COVID) ARPGX2
Influenza A by PCR: NEGATIVE
Influenza B by PCR: NEGATIVE
SARS Coronavirus 2 by RT PCR: NEGATIVE

## 2020-10-13 LAB — PROTIME-INR
INR: 1.1 (ref 0.8–1.2)
Prothrombin Time: 14.1 seconds (ref 11.4–15.2)

## 2020-10-13 LAB — APTT: aPTT: 30 seconds (ref 24–36)

## 2020-10-13 LAB — LACTIC ACID, PLASMA: Lactic Acid, Venous: 1.5 mmol/L (ref 0.5–1.9)

## 2020-10-13 MED ORDER — SODIUM CHLORIDE 0.9 % IV SOLN: INTRAVENOUS | Status: DC

## 2020-10-13 MED ORDER — TRAMADOL HCL 50 MG PO TABS
50.0000 mg | ORAL_TABLET | Freq: Three times a day (TID) | ORAL | Status: DC | PRN
Start: 2020-10-13 — End: 2020-10-15
  Administered 2020-10-14 – 2020-10-15 (×3): 50 mg via ORAL
  Filled 2020-10-13 (×3): qty 1

## 2020-10-13 MED ORDER — VANCOMYCIN HCL 2000 MG/400ML IV SOLN
2000.0000 mg | Freq: Once | INTRAVENOUS | Status: DC
  Administered 2020-10-13: 2000 mg via INTRAVENOUS
  Filled 2020-10-13: qty 400

## 2020-10-13 MED ORDER — ATORVASTATIN CALCIUM 10 MG PO TABS
10.0000 mg | ORAL_TABLET | Freq: Every day | ORAL | Status: DC
  Administered 2020-10-13 – 2020-10-14 (×2): 10 mg via ORAL
  Filled 2020-10-13 (×2): qty 1

## 2020-10-13 MED ORDER — LISINOPRIL 10 MG PO TABS
10.0000 mg | ORAL_TABLET | Freq: Every day | ORAL | Status: DC
  Administered 2020-10-13 – 2020-10-15 (×3): 10 mg via ORAL
  Filled 2020-10-13 (×3): qty 1

## 2020-10-13 MED ORDER — ADULT MULTIVITAMIN W/MINERALS CH
1.0000 | ORAL_TABLET | Freq: Every day | ORAL | Status: DC
  Administered 2020-10-14 – 2020-10-15 (×2): 1 via ORAL
  Filled 2020-10-13 (×2): qty 1

## 2020-10-13 MED ORDER — IOHEXOL 350 MG/ML SOLN
100.0000 mL | Freq: Once | INTRAVENOUS | Status: AC | PRN
  Administered 2020-10-13: 100 mL via INTRAVENOUS
  Filled 2020-10-13: qty 100

## 2020-10-13 MED ORDER — LIDOCAINE HCL (PF) 1 % IJ SOLN
10.0000 mL | Freq: Once | INTRAMUSCULAR | Status: AC
  Administered 2020-10-13: 10 mL via INTRADERMAL
  Filled 2020-10-13: qty 10

## 2020-10-13 MED ORDER — VANCOMYCIN HCL 2000 MG/400ML IV SOLN
2000.0000 mg | Freq: Once | INTRAVENOUS | Status: AC
  Administered 2020-10-13: 2000 mg via INTRAVENOUS
  Filled 2020-10-13: qty 400

## 2020-10-13 MED ORDER — VANCOMYCIN HCL 1750 MG/350ML IV SOLN
1750.0000 mg | INTRAVENOUS | Status: DC
  Administered 2020-10-14 – 2020-10-15 (×2): 1750 mg via INTRAVENOUS
  Filled 2020-10-13 (×3): qty 350

## 2020-10-13 MED ORDER — CITALOPRAM HYDROBROMIDE 20 MG PO TABS
20.0000 mg | ORAL_TABLET | Freq: Every day | ORAL | Status: DC
  Administered 2020-10-13 – 2020-10-15 (×3): 20 mg via ORAL
  Filled 2020-10-13 (×3): qty 1

## 2020-10-13 MED ORDER — MORPHINE SULFATE (PF) 2 MG/ML IV SOLN
2.0000 mg | Freq: Once | INTRAVENOUS | Status: AC
  Administered 2020-10-13: 2 mg via INTRAVENOUS
  Filled 2020-10-13: qty 1

## 2020-10-13 MED ORDER — ONDANSETRON HCL 4 MG/2ML IJ SOLN
4.0000 mg | Freq: Once | INTRAMUSCULAR | Status: AC
  Administered 2020-10-13: 4 mg via INTRAVENOUS
  Filled 2020-10-13: qty 2

## 2020-10-13 MED ORDER — ASPIRIN EC 81 MG PO TBEC
81.0000 mg | DELAYED_RELEASE_TABLET | Freq: Every day | ORAL | Status: DC
  Administered 2020-10-13 – 2020-10-15 (×3): 81 mg via ORAL
  Filled 2020-10-13 (×3): qty 1

## 2020-10-13 MED ORDER — ONDANSETRON HCL 4 MG/2ML IJ SOLN: 4.0000 mg | Freq: Four times a day (QID) | INTRAMUSCULAR | Status: DC | PRN

## 2020-10-13 MED ORDER — ONDANSETRON HCL 4 MG PO TABS
4.0000 mg | ORAL_TABLET | Freq: Four times a day (QID) | ORAL | Status: DC | PRN
  Administered 2020-10-14: 4 mg via ORAL
  Filled 2020-10-13: qty 1

## 2020-10-13 NOTE — ED Provider Notes (Addendum)
[facility] Emergency Department Provider Note   ____________________________________________   Event Date/Time   First MD Initiated Contact with Patient 10/13/20 1148     (approximate)  I have reviewed the triage vital signs and the nursing notes.   HISTORY  Chief Complaint Back Pain, Abscess, and Fever  HPI Ernest Tucker is a 60 y.o. male with a history of hypertension and kidney stones, presents to the ED due to worsening back pain and recurrent fevers.  Patient developed a large abscess to his left lower back that he had a local I&D procedure performed on on Wednesday at [facility].  He was started empirically on Bactrim at that time, and advised to report to the ED following procedure due to his temperature, but patient refused at that time.  He reports to the ED today, noting significant amounts of purulent drainage and ongoing fevers.  He had some purulent drainage from the area this morning after his daughter manipulated the wound.     Past Medical History:  Diagnosis Date   Hypertension    Kidney stones     Patient Active Problem List   Diagnosis Date Noted   Abscess of back 10/13/2020   Chest pain with moderate risk for cardiac etiology 01/13/2018   Leg edema 01/11/2018   Hyperlipidemia 01/11/2018   Prediabetes 01/11/2018   Essential hypertension 01/11/2018    Past Surgical History:  Procedure Laterality Date   NO PAST SURGERIES      Prior to Admission medications   Medication Sig Start Date End Date Taking? Authorizing Provider  aspirin EC 81 MG tablet Take 81 mg by mouth daily.   Yes [provider]  atorvastatin (LIPITOR) 10 MG tablet Take 10 mg by mouth daily. 09/19/20  Yes [provider]  citalopram (CELEXA) 20 MG tablet Take 20 mg by mouth daily. 09/19/20  Yes [provider]  lisinopril (ZESTRIL) 20 MG tablet Take 20 mg by mouth daily. 10/11/20  Yes [provider]  Multiple Vitamin  (MULTIVITAMIN) tablet Take 1 tablet by mouth daily.   Yes [provider]  sulfamethoxazole-trimethoprim (BACTRIM DS) 800-160 MG tablet Take 1 tablet by mouth 2 (two) times daily for 7 days. 10/10/20 10/17/20 Yes Cook, Jayce G, DO  traMADol (ULTRAM) 50 MG tablet Take 1 tablet (50 mg total) by mouth every 8 (eight) hours as needed for severe pain or moderate pain. 10/10/20  Yes Tommie Sams, DO    Allergies Other  Family History  Problem Relation Age of Onset   COPD Mother    Heart attack Father     Social History Social History   Tobacco Use   Smoking status: Never   Smokeless tobacco: Never  Vaping Use   Vaping Use: Never used  Substance Use Topics   Alcohol use: Yes    Alcohol/week: 3.0 standard drinks    Types: 3 Cans of beer per week    Comment: on the weekends   Drug use: No    Review of Systems  Constitutional: Reports fever/chills Cardiovascular: Denies chest pain. Respiratory: Denies shortness of breath. Gastrointestinal: No abdominal pain.  No nausea, no vomiting.  No diarrhea.  No constipation. Genitourinary: Negative for dysuria. Musculoskeletal: Negative for back pain. Skin: Negative for rash. Lower back cellulitis/abscess Neurological: Negative for headaches, focal weakness or numbness. ____________________________________________   PHYSICAL EXAM:  VITAL SIGNS: ED Triage Vitals  Enc Vitals Group     BP 10/13/20 1050 (!) 149/107  Pulse Rate 10/13/20 1050 (!) 105     Resp 10/13/20 1050 20     Temp 10/13/20 1050 99.1 F (37.3 C)     Temp Source 10/13/20 1050 Oral     SpO2 10/13/20 1050 98 %     Weight 10/13/20 1051 249 lb 12.5 oz (113.3 kg)     Height 10/13/20 1051 5\' 8"  (1.727 m)     Head Circumference --      Peak Flow --      Pain Score 10/13/20 1050 9     Pain Loc --      Pain Edu? --      Excl. in GC? --     Constitutional: Alert and oriented. Well appearing and in no acute distress. Eyes: Conjunctivae are normal. PERRL.  EOMI. Head: Atraumatic. Neck: No stridor.   Cardiovascular: Normal rate, regular rhythm. Grossly normal heart sounds.  Good peripheral circulation. Respiratory: Normal respiratory effort.  No retractions. Lungs CTAB. Gastrointestinal: Soft and nontender. No distention. No abdominal bruits. No CVA tenderness. Musculoskeletal: No lower extremity tenderness nor edema.  No joint effusions. Neurologic:  Normal speech and language. No gross focal neurologic deficits are appreciated. No gait instability. Skin:  Skin is warm, dry and intact. No rash noted.  Patient with a local area of induration and erythema just left of the midline of the lower back region.  There is a single incision noted, but no purulent drainage or packing is appreciated.  There is extensive area of erythema extending from the midline around the left flank towards the abdomen. Psychiatric: Mood and affect are normal. Speech and behavior are normal.    ____________________________________________   LABS (all labs ordered are listed, but only abnormal results are displayed)  Labs Reviewed  COMPREHENSIVE METABOLIC PANEL - Abnormal; Notable for the following components:      Result Value   Sodium 132 (*)    CO2 21 (*)    Glucose, Bld 140 (*)    Calcium 8.8 (*)    All other components within normal limits  CBC WITH DIFFERENTIAL/PLATELET - Abnormal; Notable for the following components:   WBC 16.0 (*)    Neutro Abs 12.5 (*)    Monocytes Absolute 1.5 (*)    Abs Immature Granulocytes 0.12 (*)    All other components within normal limits  URINALYSIS, COMPLETE (UACMP) WITH MICROSCOPIC - Abnormal; Notable for the following components:   Color, Urine AMBER (*)    APPearance HAZY (*)    Specific Gravity, Urine 1.032 (*)    Hgb urine dipstick SMALL (*)    Ketones, ur 20 (*)    Protein, ur 30 (*)    Bacteria, UA FEW (*)    All other components within normal limits  RESP PANEL BY RT-PCR (FLU A&B, COVID) ARPGX2  CULTURE, BLOOD  (SINGLE)  LACTIC ACID, PLASMA  PROTIME-INR  APTT  HIV ANTIBODY (ROUTINE TESTING W REFLEX)   ____________________________________________  EKG   ____________________________________________  RADIOLOGY 10/15/20, personally viewed and evaluated these images (plain radiographs) as part of my medical decision making, as well as reviewing the written report by the radiologist.  ED MD interpretation:  agree with report  Official radiology report(s): CT ABDOMEN PELVIS W CONTRAST  Result Date: 10/13/2020 CLINICAL DATA:  Fever. Posterior abdominal wall cellulitis. Recent I&D of abscess. EXAM: CT ABDOMEN AND PELVIS WITH CONTRAST TECHNIQUE: Multidetector CT imaging of the abdomen and pelvis was performed using the standard protocol following bolus administration of intravenous contrast.  CONTRAST:  OMNIPAQUE IOHEXOL 350 MG/ML SOLN COMPARISON:  08/28/2014 FINDINGS: Lower Chest: No acute findings. Hepatobiliary: No hepatic masses identified. Mild diffuse hepatic steatosis again seen. Gallbladder is unremarkable. No evidence of biliary ductal dilatation. Pancreas:  No mass or inflammatory changes. Spleen: Within normal limits in size and appearance. Adrenals/Urinary Tract: A few tiny sub-cm renal cysts remain stable. No masses identified. No evidence of ureteral calculi or hydronephrosis. Stomach/Bowel: No evidence of obstruction, inflammatory process or abnormal fluid collections. Normal appendix visualized. Diverticulosis is seen mainly involving the descending and sigmoid colon, however there is no evidence of diverticulitis. Vascular/Lymphatic: No pathologically enlarged lymph nodes. No acute vascular findings. Aortic atherosclerotic calcification noted. Reproductive:  No mass or other significant abnormality. Other: Small left inguinal hernia again seen which contains only fat. Edema in skin thickening is seen in the left posterior abdominal wall soft tissues. A small focus of  increased attenuation and tiny air bubbles is seen in the subcutaneous tissues on image 34/2, consistent with site of recent I and D. no evidence of abscess. Musculoskeletal:  No suspicious bone lesions identified. IMPRESSION: Subcutaneous edema and skin thickening in left posterior abdominal wall, consistent with cellulitis. No evidence of abscess. Colonic diverticulosis, without radiographic evidence of diverticulitis. Stable small left inguinal hernia, which contains only fat. Stable hepatic steatosis. Aortic Atherosclerosis (ICD10-I70.0). Electronically Signed   By: Danae Orleans M.D.   On: 10/13/2020 16:34    ____________________________________________   PROCEDURES  Zofran 4 mg IVP Morphine 2 mg IVP Vancomycin 2000 mg IVPB IV carrier fluids   Procedure(s) performed (including Critical Care):  Marland KitchenMarland KitchenIncision and Drainage  Date/Time: 10/13/2020 12:27 PM Performed by: Lissa Hoard, PA-C Authorized by: Lissa Hoard, PA-C   Consent:    Consent obtained:  Verbal   Consent given by:  Patient   Risks, benefits, and alternatives were discussed: yes     Risks discussed:  Bleeding, incomplete drainage and pain Universal protocol:    Procedure explained and questions answered to patient or proxy's satisfaction: yes     Site/side marked: yes     Patient identity confirmed:  Verbally with patient Location:    Type:  Abscess   Size:  4   Location:  Trunk   Trunk location:  Back Pre-procedure details:    Skin preparation:  Povidone-iodine Sedation:    Sedation type:  None Anesthesia:    Anesthesia method:  Local infiltration   Local anesthetic:  Lidocaine 1% w/o epi Procedure type:    Complexity:  Complex Procedure details:    Ultrasound guidance: no     Needle aspiration: no     Incision types:  Single straight   Incision depth:  Subcutaneous   Wound management:  Probed and deloculated and irrigated with saline   Drainage:  Purulent   Drainage amount:   Scant   Wound treatment:  Wound left open   Packing materials:  1/2 in iodoform gauze   Amount 1/2" iodoform:  3 Post-procedure details:    Procedure completion:  Tolerated well, no immediate complications   ____________________________________________   INITIAL IMPRESSION / ASSESSMENT AND PLAN / ED COURSE  As part of my medical decision making, I reviewed the following data within the electronic MEDICAL RECORD NUMBER Labs reviewed as above, Radiograph reviewed pending, Discussed with admitting physician Arvid Right, MD,  A consult was requested and obtained from this/these consultant(s) Lady Gary, MD - Surgery, and Notes from prior ED visits    DDX: abscess, cellulitis, intra-abdominal infection  Patient ED evaluation of worsening skin abscess to the left lower back, presented to the ED with fevers and tachycardia.  Patient was evaluated for supplies in ED, found to have an elevated white count of 16,000.  He was also noted to have extension of the superficial erythema to the dorsal lateral back.  Local I&D was performed here with only minimal amount of purulent drainage expressed.  Wound was probed and packed with iodoform packing.  Patient started empirically on vancomycin due to his vital signs on presentation.  Fluid bolus was also provided, and patient will be further evaluated with CT imaging to evaluate the wound.  He will be admitted to the hospital service for ongoing IV medication administration.  Surgery has also been consulted for further evaluation.  Patient is reported plan at this time, and care will be transferred to the hospital service.  ____________________________________________   FINAL CLINICAL IMPRESSION(S) / ED DIAGNOSES  Final diagnoses:  Cellulitis and abscess of trunk  Sepsis without acute organ dysfunction, due to unspecified organism [facility](HCC)     ED Discharge Orders     None        Note:  This document was prepared using Dragon voice recognition software and  may include unintentional dictation errors.    Lissa HoardMenshew, Xavian Hardcastle V Bacon, PA-C 10/13/20 1657    Lissa HoardMenshew, Lovella Hardie V Bacon, PA-C 10/13/20 1741    Gilles ChiquitoSmith, Zachary P, MD 10/13/20 [phone]

## 2020-10-13 NOTE — Consult Note (Signed)
Pharmacy Antibiotic Note  Ernest Tucker is a 60 y.o. male admitted on 10/13/2020 with cellulitis.  Pharmacy has been consulted for Vancomycin dosing.  Plan: Vancomycin 1750 mg IV Q 24 hrs. Goal AUC 400-550. Expected AUC: 469.4 Expected Css: 9.1 SCr used: 1.04    Height: 5\' 8"  (172.7 cm) Weight: 113.3 kg (249 lb 12.5 oz) IBW/kg (Calculated) : 68.4  Temp (24hrs), Avg:99.1 F (37.3 C), Min:99.1 F (37.3 C), Max:99.1 F (37.3 C)  Recent Labs  Lab 10/13/20 1052  WBC 16.0*  CREATININE 1.04  LATICACIDVEN 1.5    Estimated Creatinine Clearance: 93.5 mL/min (by C-G formula based on SCr of 1.04 mg/dL).    Allergies  Allergen Reactions   Other Swelling    NUTS NUTS     Antimicrobials this admission: Vancomycin 8/19 >>   Microbiology results: 8/19 BCx: pending  Thank you for allowing pharmacy to be a part of this patient's care.  9/19 10/13/2020 5:37 PM

## 2020-10-13 NOTE — ED Triage Notes (Signed)
Pt comes into the ED via POV c/o back pain, large abscess that has been lanced on his back and now new onset fever.  Pt had the abscess lanced on Wednesday but the redness is now spreading, he has fever, and uncontrolled pain.  Pt states he also is still getting significant amounts of purulent drainage out of the wound.  Pt ambulatory at this time.  Pt states he took 500mg  of tylenol this morning at 05:00.  Pt states his temp at home was 102.

## 2020-10-13 NOTE — ED Notes (Signed)
Informed RN bed assigned 

## 2020-10-13 NOTE — ED Notes (Signed)
Family moved to 14 with patient.

## 2020-10-13 NOTE — ED Notes (Signed)
Patient transported to CT 

## 2020-10-13 NOTE — H&P (Signed)
History and Physical    Ernest Tucker GMW:[id number] DOB: 24-May-1960 DOA: 10/13/2020  PCP: Rayetta Humphrey, MD   Patient coming from: Home  I have personally briefly reviewed patient's old medical records in [facility]  Chief Complaint: Fever  HPI: Ernest Tucker is a 60 y.o. male with medical history significant for obesity, hypertension, depression who presents to the emergency room for evaluation of fever. Patient was seen at the urgent care center 2 days ago for evaluation of a bump on his left lumbar area.  He had attempted to drain it at home but noticed that it progressively got bigger in size and he also had a fever associated with chills.  He had a T-max of 101.5.  He complained about pain involving his left lumbar area associated with redness and differential warmth.  Examination revealed an abscess which was drained at the urgent care center.  Patient was offered admission to the hospital due to concerns for possible bacteremia which he declined.  He was given a dose of IM Rocephin and discharged on Bactrim and tramadol as needed for pain and advised to go to the ER if his pain worsens or if he continues to have a fever. According to his wife he did well for 24 hours but started having fevers again on the day of admission, he had a T-max of 101.47F.  He took some Tylenol at home and presented to the emergency room. The physician assistant in the emergency room attempted to drain the abscess and packed it with iodoform gauze. He denies having any chest pain, no shortness of breath, no headache, no nausea, no vomiting, no abdominal pain, no changes in his bowel habits, no dizziness, no lightheadedness, no leg swelling, no focal deficits or blurred vision. Labs show sodium 132, potassium 3.8, chloride 101, bicarb 21, glucose 140, BUN 18, creatinine 1.04, calcium 8.8, alkaline phosphatase 59, albumin 3.5, AST 18, ALT 27, total protein 7.9, lactic acid 1.5, white count 16, hemoglobin 16,  hematocrit 45.9, MCV 92.7, RDW 12.5, platelet count 211, PT 14.1, INR 1.1 Respiratory viral panel is negative Urine analysis is sterile   ED Course: Patient is a 60 year old male who presents to the ER for evaluation of fever as well as an abscess on his left lumbar area. He had a T-max of 101.5 at home. Patient continues to have an area of erythema and induration involving the left lumbar area.  He has leukocytosis of 16,000. He will be admitted to the hospital for further evaluation and surgery will be consulted for I&D.  Review of Systems: As per HPI otherwise all other systems reviewed and negative.    Past Medical History:  Diagnosis Date   Hypertension    Kidney stones     Past Surgical History:  Procedure Laterality Date   NO PAST SURGERIES       reports that he has never smoked. He has never used smokeless tobacco. He reports current alcohol use of about 3.0 standard drinks per week. He reports that he does not use drugs.  Allergies  Allergen Reactions   Other Swelling    NUTS NUTS     Family History  Problem Relation Age of Onset   COPD Mother    Heart attack Father       Prior to Admission medications   Medication Sig Start Date End Date Taking? Authorizing Provider  aspirin EC 81 MG tablet Take 81 mg by mouth daily.    [provider]  atorvastatin (LIPITOR) 10 MG tablet Take 10 mg by mouth daily. 09/19/20   [provider]  citalopram (CELEXA) 20 MG tablet Take 20 mg by mouth daily. 09/19/20   [provider]  lisinopril (PRINIVIL,ZESTRIL) 10 MG tablet Take 10 mg by mouth daily.    [provider]  Multiple Vitamin (MULTIVITAMIN) tablet Take 1 tablet by mouth daily.    [provider]  sulfamethoxazole-trimethoprim (BACTRIM DS) 800-160 MG tablet Take 1 tablet by mouth 2 (two) times daily for 7 days. 10/10/20 10/17/20  Tommie Sams, DO  traMADol (ULTRAM) 50 MG tablet Take 1 tablet (50 mg total) by mouth every 8  (eight) hours as needed for severe pain or moderate pain. 10/10/20   Tommie Sams, DO    Physical Exam: Vitals:   10/13/20 1050 10/13/20 1051 10/13/20 1239  BP: (!) 149/107  (!) 144/79  Pulse: (!) 105  79  Resp: 20  18  Temp: 99.1 F (37.3 C)  99.1 F (37.3 C)  TempSrc: Oral  Oral  SpO2: 98%  99%  Weight:  113.3 kg   Height:  5\' 8"  (1.727 m)      Vitals:   10/13/20 1050 10/13/20 1051 10/13/20 1239  BP: (!) 149/107  (!) 144/79  Pulse: (!) 105  79  Resp: 20  18  Temp: 99.1 F (37.3 C)  99.1 F (37.3 C)  TempSrc: Oral  Oral  SpO2: 98%  99%  Weight:  113.3 kg   Height:  5\' 8"  (1.727 m)       Constitutional: Alert and oriented x 3 . Not in any apparent distress HEENT:      Head: Normocephalic and atraumatic.         Eyes: PERLA, EOMI, Conjunctivae are normal. Sclera is non-icteric.       Mouth/Throat: Mucous membranes are moist.       Neck: Supple with no signs of meningismus. Cardiovascular: Tachycardic. No murmurs, gallops, or rubs. 2+ symmetrical distal pulses are present . No JVD. No LE edema Respiratory: Respiratory effort normal .Lungs sounds clear bilaterally. No wheezes, crackles, or rhonchi.  Gastrointestinal: Soft, non tender, and non distended with positive bowel sounds.  Genitourinary: No CVA tenderness. Musculoskeletal: Indurated, erythematous area in the left lumbar region, tender to palpation. No cyanosis, or erythema of extremities. Neurologic:  Face is symmetric. Moving all extremities. No gross focal neurologic deficits . Skin: Skin is warm, dry.  No rash or ulcers Psychiatric: Mood and affect are normal    Labs on Admission: I have personally reviewed following labs and imaging studies  CBC: Recent Labs  Lab 10/13/20 1052  WBC 16.0*  NEUTROABS 12.5*  HGB 16.0  HCT 45.9  MCV 92.7  PLT 211   Basic Metabolic Panel: Recent Labs  Lab 10/13/20 1052  NA 132*  K 3.8  CL 101  CO2 21*  GLUCOSE 140*  BUN 18  CREATININE 1.04  CALCIUM 8.8*    GFR: Estimated Creatinine Clearance: 93.5 mL/min (by C-G formula based on SCr of 1.04 mg/dL). Liver Function Tests: Recent Labs  Lab 10/13/20 1052  AST 18  ALT 27  ALKPHOS 59  BILITOT 1.0  PROT 7.9  ALBUMIN 3.5   No results for input(s): LIPASE, AMYLASE in the last 168 hours. No results for input(s): AMMONIA in the last 168 hours. Coagulation Profile: Recent Labs  Lab 10/13/20 1210  INR 1.1   Cardiac Enzymes: No results for input(s): CKTOTAL, CKMB, CKMBINDEX, TROPONINI in the last  168 hours. BNP (last 3 results) No results for input(s): PROBNP in the last 8760 hours. HbA1C: No results for input(s): HGBA1C in the last 72 hours. CBG: No results for input(s): GLUCAP in the last 168 hours. Lipid Profile: No results for input(s): CHOL, HDL, LDLCALC, TRIG, CHOLHDL, LDLDIRECT in the last 72 hours. Thyroid Function Tests: No results for input(s): TSH, T4TOTAL, FREET4, T3FREE, THYROIDAB in the last 72 hours. Anemia Panel: No results for input(s): VITAMINB12, FOLATE, FERRITIN, TIBC, IRON, RETICCTPCT in the last 72 hours. Urine analysis:    Component Value Date/Time   COLORURINE AMBER (A) 10/13/2020 1052   APPEARANCEUR HAZY (A) 10/13/2020 1052   LABSPEC 1.032 (H) 10/13/2020 1052   PHURINE 5.0 10/13/2020 1052   GLUCOSEU NEGATIVE 10/13/2020 1052   HGBUR SMALL (A) 10/13/2020 1052   BILIRUBINUR NEGATIVE 10/13/2020 1052   KETONESUR 20 (A) 10/13/2020 1052   PROTEINUR 30 (A) 10/13/2020 1052   NITRITE NEGATIVE 10/13/2020 1052   LEUKOCYTESUR NEGATIVE 10/13/2020 1052    Radiological Exams on Admission: No results found.   Assessment/Plan Principal Problem:   Abscess of back Active Problems:   Prediabetes   Essential hypertension   Abscess of back Patient presents for evaluation of fever with a T-max of 101.5 and marked leukocytosis Consult surgery for I&D Place patient on IV vancomycin Follow-up results of blood cultures Supportive care with antipyretics and pain  medication    Hypertension Continue lisinopril   Depression Continue Celexa  DVT prophylaxis: SCD  Code Status: full code  Family Communication: Greater than 50% of time was spent discussing patient's condition and plan of care with him and his wife at the bedside.  All questions and concerns have been addressed.  They verbalized understanding and agree with the plan. Disposition Plan: Back to previous home environment Consults called: Surgery Status: Observation    Mariane Burpee MD [facility]     10/13/2020, 4:08 PM

## 2020-10-13 NOTE — ED Notes (Signed)
Pt NAD in bed, a/ox4, states has had an abscess on back and it was lanced and drained but then began to develop a fever. Pt denies current pain unless he moves on it. VSS.

## 2020-10-14 DIAGNOSIS — L02212 Cutaneous abscess of back [any part, except buttock]: Principal | ICD-10-CM

## 2020-10-14 LAB — CBC
HCT: 44.4 % (ref 39.0–52.0)
Hemoglobin: 15.5 g/dL (ref 13.0–17.0)
MCH: 32.4 pg (ref 26.0–34.0)
MCHC: 34.9 g/dL (ref 30.0–36.0)
MCV: 92.7 fL (ref 80.0–100.0)
Platelets: 216 10*3/uL (ref 150–400)
RBC: 4.79 MIL/uL (ref 4.22–5.81)
RDW: 12.3 % (ref 11.5–15.5)
WBC: 10.9 10*3/uL — ABNORMAL HIGH (ref 4.0–10.5)
nRBC: 0 % (ref 0.0–0.2)

## 2020-10-14 LAB — BASIC METABOLIC PANEL
Anion gap: 9 (ref 5–15)
BUN: 16 mg/dL (ref 6–20)
CO2: 23 mmol/L (ref 22–32)
Calcium: 8.7 mg/dL — ABNORMAL LOW (ref 8.9–10.3)
Chloride: 102 mmol/L (ref 98–111)
Creatinine, Ser: 1.03 mg/dL (ref 0.61–1.24)
GFR, Estimated: >60 mL/min (ref >60)
Glucose, Bld: 124 mg/dL — ABNORMAL HIGH (ref 70–99)
Potassium: 3.7 mmol/L (ref 3.5–5.1)
Sodium: 134 mmol/L — ABNORMAL LOW (ref 135–145)

## 2020-10-14 LAB — HIV ANTIBODY (ROUTINE TESTING W REFLEX): HIV Screen 4th Generation wRfx: NONREACTIVE

## 2020-10-14 MED ORDER — ENOXAPARIN SODIUM 60 MG/0.6ML IJ SOSY
0.5000 mg/kg | PREFILLED_SYRINGE | INTRAMUSCULAR | Status: DC
  Administered 2020-10-14: 57.5 mg via SUBCUTANEOUS
  Filled 2020-10-14: qty 0.6

## 2020-10-14 MED ORDER — OXYCODONE-ACETAMINOPHEN 5-325 MG PO TABS
1.0000 | ORAL_TABLET | ORAL | Status: DC | PRN
  Administered 2020-10-14 – 2020-10-15 (×2): 1 via ORAL
  Filled 2020-10-14 (×2): qty 1

## 2020-10-14 NOTE — Progress Notes (Signed)
PROGRESS NOTE    Ernest MIMBS  YNW:[id number] DOB: December 12, 1960 DOA: 10/13/2020 PCP: Rayetta Humphrey, MD    Brief Narrative:  Ernest Tucker is a 60 y.o. male with medical history significant for obesity, hypertension, depression who presents to the emergency room for evaluation of fever  Found with a back abscess  Consultants:  Surgery  Procedures: I&D  Antimicrobials:  Vancomycin   Subjective: Has pain upon opening the dressing, erythema, mild fluctuance on the sides and mild swelling  Objective: Vitals:   10/13/20 2106 10/14/20 0024 10/14/20 0352 10/14/20 0748  BP: (!) 139/91 111/75 130/86 129/87  Pulse: 95 85 79 78  Resp: 20 19 17 17   Temp: 97.8 F (36.6 C) 98.6 F (37 C) 98.3 F (36.8 C) 98.1 F (36.7 C)  TempSrc:      SpO2: 97% 98% 96% 94%  Weight:      Height:        Intake/Output Summary (Last 24 hours) at 10/14/2020 0858 Last data filed at 10/14/2020 0247 Gross per 24 hour  Intake 22.12 ml  Output --  Net 22.12 ml   Filed Weights   10/13/20 1051  Weight: 113.3 kg    Examination:  General exam: Appears calm and comfortable  Respiratory system: Clear to auscultation. Respiratory effort normal. Cardiovascular system: S1 & S2 heard, RRR. No JVD, murmurs, rubs, gallops or clicks.  Gastrointestinal system: Abdomen is nondistended, soft and nontender. No organomegaly or masses felt. Normal bowel sounds heard. Central nervous system: Alert and oriented. No focal neurological deficits. Extremities: No edema Back: Mild fluctuance, erythema, tender to touch, warm to touch Psychiatry: Judgement and insight appear normal. Mood & affect appropriate.     Data Reviewed: I have personally reviewed following labs and imaging studies  CBC: Recent Labs  Lab 10/13/20 1052 10/14/20 0525  WBC 16.0* 10.9*  NEUTROABS 12.5*  --   HGB 16.0 15.5  HCT 45.9 44.4  MCV 92.7 92.7  PLT 211 216   Basic Metabolic Panel: Recent Labs  Lab 10/13/20 1052  10/14/20 0525  NA 132* 134*  K 3.8 3.7  CL 101 102  CO2 21* 23  GLUCOSE 140* 124*  BUN 18 16  CREATININE 1.04 1.03  CALCIUM 8.8* 8.7*   GFR: Estimated Creatinine Clearance: 94.4 mL/min (by C-G formula based on SCr of 1.03 mg/dL). Liver Function Tests: Recent Labs  Lab 10/13/20 1052  AST 18  ALT 27  ALKPHOS 59  BILITOT 1.0  PROT 7.9  ALBUMIN 3.5   No results for input(s): LIPASE, AMYLASE in the last 168 hours. No results for input(s): AMMONIA in the last 168 hours. Coagulation Profile: Recent Labs  Lab 10/13/20 1210  INR 1.1   Cardiac Enzymes: No results for input(s): CKTOTAL, CKMB, CKMBINDEX, TROPONINI in the last 168 hours. BNP (last 3 results) No results for input(s): PROBNP in the last 8760 hours. HbA1C: No results for input(s): HGBA1C in the last 72 hours. CBG: No results for input(s): GLUCAP in the last 168 hours. Lipid Profile: No results for input(s): CHOL, HDL, LDLCALC, TRIG, CHOLHDL, LDLDIRECT in the last 72 hours. Thyroid Function Tests: No results for input(s): TSH, T4TOTAL, FREET4, T3FREE, THYROIDAB in the last 72 hours. Anemia Panel: No results for input(s): VITAMINB12, FOLATE, FERRITIN, TIBC, IRON, RETICCTPCT in the last 72 hours. Sepsis Labs: Recent Labs  Lab 10/13/20 1052  LATICACIDVEN 1.5    Recent Results (from the past 240 hour(s))  Blood culture (routine single)     Status: None (  Preliminary result)   Collection Time: 10/13/20 12:10 PM   Specimen: BLOOD  Result Value Ref Range Status   Specimen Description BLOOD BLOOD LEFT ARM  Final   Special Requests   Final    BOTTLES DRAWN AEROBIC AND ANAEROBIC Blood Culture adequate volume   Culture   Final    NO GROWTH < 24 HOURS Performed at [facility], [address] [place], [place] [address]    Report Status PENDING  Incomplete  Resp Panel by RT-PCR (Flu A&B, Covid) Nasopharyngeal Swab     Status: None   Collection Time: 10/13/20  1:15 PM   Specimen: Nasopharyngeal Swab;  Nasopharyngeal(NP) swabs in vial transport medium  Result Value Ref Range Status   SARS Coronavirus 2 by RT PCR NEGATIVE NEGATIVE Final    Comment: (NOTE) SARS-CoV-2 target nucleic acids are NOT DETECTED.  The SARS-CoV-2 RNA is generally detectable in upper respiratory specimens during the acute phase of infection. The lowest concentration of SARS-CoV-2 viral copies this assay can detect is 138 copies/mL. A negative result does not preclude SARS-Cov-2 infection and should not be used as the sole basis for treatment or other patient management decisions. A negative result may occur with  improper specimen collection/handling, submission of specimen other than nasopharyngeal swab, presence of viral mutation(s) within the areas targeted by this assay, and inadequate number of viral copies(<138 copies/mL). A negative result must be combined with clinical observations, patient history, and epidemiological information. The expected result is Negative.  Fact Sheet for Patients:  BloggerCourse.com  Fact Sheet for Healthcare Providers:  SeriousBroker.it  This test is no t yet approved or cleared by the [place] FDA and  has been authorized for detection and/or diagnosis of SARS-CoV-2 by FDA under an Emergency Use Authorization (EUA). This EUA will remain  in effect (meaning this test can be used) for the duration of the COVID-19 declaration under Section 564(b)(1) of the Act, 21 U.S.C.section 360bbb-3(b)(1), unless the authorization is terminated  or revoked sooner.       Influenza A by PCR NEGATIVE NEGATIVE Final   Influenza B by PCR NEGATIVE NEGATIVE Final    Comment: (NOTE) The Xpert Xpress SARS-CoV-2/FLU/RSV plus assay is intended as an aid in the diagnosis of influenza from Nasopharyngeal swab specimens and should not be used as a sole basis for treatment. Nasal washings and aspirates are unacceptable for Xpert Xpress  SARS-CoV-2/FLU/RSV testing.  Fact Sheet for Patients: BloggerCourse.com  Fact Sheet for Healthcare Providers: SeriousBroker.it  This test is not yet approved or cleared by the [place] FDA and has been authorized for detection and/or diagnosis of SARS-CoV-2 by FDA under an Emergency Use Authorization (EUA). This EUA will remain in effect (meaning this test can be used) for the duration of the COVID-19 declaration under Section 564(b)(1) of the Act, 21 U.S.C. section 360bbb-3(b)(1), unless the authorization is terminated or revoked.  Performed at [facility], [address] [place], [place] [address]          Radiology Studies: CT ABDOMEN PELVIS W CONTRAST  Result Date: 10/13/2020 CLINICAL DATA:  Fever. Posterior abdominal wall cellulitis. Recent I&D of abscess. EXAM: CT ABDOMEN AND PELVIS WITH CONTRAST TECHNIQUE: Multidetector CT imaging of the abdomen and pelvis was performed using the standard protocol following bolus administration of intravenous contrast. CONTRAST:  OMNIPAQUE IOHEXOL 350 MG/ML SOLN COMPARISON:  08/28/2014 FINDINGS: Lower Chest: No acute findings. Hepatobiliary: No hepatic masses identified. Mild diffuse hepatic steatosis again seen. Gallbladder is unremarkable. No evidence  of biliary ductal dilatation. Pancreas:  No mass or inflammatory changes. Spleen: Within normal limits in size and appearance. Adrenals/Urinary Tract: A few tiny sub-cm renal cysts remain stable. No masses identified. No evidence of ureteral calculi or hydronephrosis. Stomach/Bowel: No evidence of obstruction, inflammatory process or abnormal fluid collections. Normal appendix visualized. Diverticulosis is seen mainly involving the descending and sigmoid colon, however there is no evidence of diverticulitis. Vascular/Lymphatic: No pathologically enlarged lymph nodes. No acute vascular findings. Aortic atherosclerotic  calcification noted. Reproductive:  No mass or other significant abnormality. Other: Small left inguinal hernia again seen which contains only fat. Edema in skin thickening is seen in the left posterior abdominal wall soft tissues. A small focus of increased attenuation and tiny air bubbles is seen in the subcutaneous tissues on image 34/2, consistent with site of recent I and D. no evidence of abscess. Musculoskeletal:  No suspicious bone lesions identified. IMPRESSION: Subcutaneous edema and skin thickening in left posterior abdominal wall, consistent with cellulitis. No evidence of abscess. Colonic diverticulosis, without radiographic evidence of diverticulitis. Stable small left inguinal hernia, which contains only fat. Stable hepatic steatosis. Aortic Atherosclerosis (ICD10-I70.0). Electronically Signed   By: Danae Orleans M.D.   On: 10/13/2020 16:34        Scheduled Meds:  aspirin EC  81 mg Oral Daily   atorvastatin  10 mg Oral q1800   citalopram  20 mg Oral Daily   lisinopril  10 mg Oral Daily   multivitamin with minerals  1 tablet Oral Daily   Continuous Infusions:  sodium chloride 100 mL/hr at 10/14/20 0357   vancomycin      Assessment & Plan:   Principal Problem:   Abscess of back Active Problems:   Prediabetes   Essential hypertension   Abscess of back Patient presents for evaluation of fever with a T-max of 101.5 and marked leukocytosis 8/20 service input was appreciated.  Was I&D in the ER.  CT imaging demonstrated no further drainable area of cellulitis has responded well to IV antibiotics.  No further surgical intervention.  Dressing needs to be changed twice daily while he is in the hospital.  Would continue IV antibiotics until discharge and then changed to oral for total of 7 days of therapy.  Continue packing the wound daily at home.  No need for outpatient follow-up with general surgery. Will continue IV antibiotics Follow-up culture results       Hypertension Continue lisinopril     Depression Continue Celexa   DVT prophylaxis: Lovenox Code Status: Full Family Communication: None at bedside Disposition Plan:  Status is: Inpatient  Remains inpatient appropriate because:IV treatments appropriate due to intensity of illness or inability to take PO  Dispo: The patient is from: Home              Anticipated d/c is to: Home              Patient currently is not medically stable to d/c.   Difficult to place patient No    Possibly home in 1 to 2 days.  Needs to continue on IV antibiotics.  Cultures pending.  Has pain with dressing change need to have better pain control.        LOS: 1 day   Time spent: 35 minutes with more than 50% on COC    Lynn Ito, MD [facility] Pager [phone]  If 7PM-7AM, please contact night-coverage 10/14/2020, 8:58 AM

## 2020-10-14 NOTE — Consult Note (Signed)
Reason for Consult: Back abscess Referring Physician: Ronnie Doss, PA-C (emergency department)  Ernest Tucker is an 60 y.o. male.  HPI: Ernest Tucker is a 60 y.o. male with a history of hypertension and kidney stones, presents to the ED due to worsening back pain and recurrent fevers.  Patient developed a large abscess to his left lower back that he had a local I&D procedure performed on on Wednesday at [facility].  He was started empirically on Bactrim at that time, and advised to report to the ED following procedure due to his temperature, but patient refused at that time.  He reports to the ED today, noting significant amounts of purulent drainage and ongoing fevers.    Additional incision and drainage was performed by the emergency department provider and the wound was packed.  General surgery was consulted to evaluate for any additional interventional needs.  A CT scan was requested and performed.  This did not show any additional areas of drainable fluid.  The patient was admitted to the hospitalist service for IV antibiotics.  Today, he reports that while he is still sore, he feels significantly improved.  He has been afebrile since admission.  His white blood cell count is down to 10.9 from 16 on admission.  Past Medical History:  Diagnosis Date   Hypertension    Kidney stones     Past Surgical History:  Procedure Laterality Date   NO PAST SURGERIES      Family History  Problem Relation Age of Onset   COPD Mother    Heart attack Father     Social History:  reports that he has never smoked. He has never used smokeless tobacco. He reports current alcohol use of about 3.0 standard drinks per week. He reports that he does not use drugs.  Allergies:  Allergies  Allergen Reactions   Other Swelling    NUTS NUTS     Medications: I have reviewed the patient's current medications.  Results for orders placed or performed during the hospital encounter of 10/13/20 (from  the past 48 hour(s))  Lactic acid, plasma     Status: None   Collection Time: 10/13/20 10:52 AM  Result Value Ref Range   Lactic Acid, Venous 1.5 0.5 - 1.9 mmol/L    Comment: Performed at [facility], [address], [place], [place] [address]  Comprehensive metabolic panel     Status: Abnormal   Collection Time: 10/13/20 10:52 AM  Result Value Ref Range   Sodium 132 (L) 135 - 145 mmol/L   Potassium 3.8 3.5 - 5.1 mmol/L   Chloride 101 98 - 111 mmol/L   CO2 21 (L) 22 - 32 mmol/L   Glucose, Bld 140 (H) 70 - 99 mg/dL    Comment: Glucose reference range applies only to samples taken after fasting for at least 8 hours.   BUN 18 6 - 20 mg/dL   Creatinine, Ser 2.54 0.61 - 1.24 mg/dL   Calcium 8.8 (L) 8.9 - 10.3 mg/dL   Total Protein 7.9 6.5 - 8.1 g/dL   Albumin 3.5 3.5 - 5.0 g/dL   AST 18 15 - 41 U/L   ALT 27 0 - 44 U/L   Alkaline Phosphatase 59 38 - 126 U/L   Total Bilirubin 1.0 0.3 - 1.2 mg/dL   GFR, Estimated >27 >06 mL/min    Comment: (NOTE) Calculated using the CKD-EPI Creatinine Equation (2021)    Anion gap 10 5 - 15  Comment: Performed at [facility], [address] [place], [place] [address]  CBC with Differential     Status: Abnormal   Collection Time: 10/13/20 10:52 AM  Result Value Ref Range   WBC 16.0 (H) 4.0 - 10.5 K/uL   RBC 4.95 4.22 - 5.81 MIL/uL   Hemoglobin 16.0 13.0 - 17.0 g/dL   HCT 60.445.9 54.039.0 - 98.152.0 %   MCV 92.7 80.0 - 100.0 fL   MCH 32.3 26.0 - 34.0 pg   MCHC 34.9 30.0 - 36.0 g/dL   RDW 19.112.5 47.811.5 - 29.515.5 %   Platelets 211 150 - 400 K/uL   nRBC 0.0 0.0 - 0.2 %   Neutrophils Relative % 79 %   Neutro Abs 12.5 (H) 1.7 - 7.7 K/uL   Lymphocytes Relative 11 %   Lymphs Abs 1.8 0.7 - 4.0 K/uL   Monocytes Relative 9 %   Monocytes Absolute 1.5 (H) 0.1 - 1.0 K/uL   Eosinophils Relative 0 %   Eosinophils Absolute 0.1 0.0 - 0.5 K/uL   Basophils Relative 0 %   Basophils Absolute 0.1 0.0 - 0.1 K/uL   Immature Granulocytes 1 %   Abs Immature  Granulocytes 0.12 (H) 0.00 - 0.07 K/uL    Comment: Performed at [facility], [address] [place], [place] [address]  Urinalysis, Complete w Microscopic     Status: Abnormal   Collection Time: 10/13/20 10:52 AM  Result Value Ref Range   Color, Urine AMBER (A) YELLOW    Comment: BIOCHEMICALS MAY BE AFFECTED BY COLOR   APPearance HAZY (A) CLEAR   Specific Gravity, Urine 1.032 (H) 1.005 - 1.030   pH 5.0 5.0 - 8.0   Glucose, UA NEGATIVE NEGATIVE mg/dL   Hgb urine dipstick SMALL (A) NEGATIVE   Bilirubin Urine NEGATIVE NEGATIVE   Ketones, ur 20 (A) NEGATIVE mg/dL   Protein, ur 30 (A) NEGATIVE mg/dL   Nitrite NEGATIVE NEGATIVE   Leukocytes,Ua NEGATIVE NEGATIVE   RBC / HPF 0-5 0 - 5 RBC/hpf   WBC, UA 0-5 0 - 5 WBC/hpf   Bacteria, UA FEW (A) NONE SEEN   Squamous Epithelial / LPF 0-5 0 - 5   Mucus PRESENT     Comment: Performed at [facility], [address] [place], [place] [address]  Protime-INR     Status: None   Collection Time: 10/13/20 12:10 PM  Result Value Ref Range   Prothrombin Time 14.1 11.4 - 15.2 seconds   INR 1.1 0.8 - 1.2    Comment: (NOTE) INR goal varies based on device and disease states. Performed at [facility], [address] [place], [place] [address]   APTT     Status: None   Collection Time: 10/13/20 12:10 PM  Result Value Ref Range   aPTT 30 24 - 36 seconds    Comment: Performed at [facility], [address] [place], [place] [address]  Blood culture (routine single)     Status: None (Preliminary result)   Collection Time: 10/13/20 12:10 PM   Specimen: BLOOD  Result Value Ref Range   Specimen Description BLOOD BLOOD LEFT ARM    Special Requests      BOTTLES DRAWN AEROBIC AND ANAEROBIC Blood Culture adequate volume   Culture      NO GROWTH < 24 HOURS Performed at [facility], [address] [place], [place] [address]    Report Status PENDING   Resp Panel by RT-PCR (Flu A&B, Covid)  Nasopharyngeal Swab     Status: None   Collection Time: 10/13/20  1:15 PM   Specimen: Nasopharyngeal Swab; Nasopharyngeal(NP) swabs in vial transport medium  Result Value Ref Range   SARS Coronavirus 2 by RT PCR NEGATIVE NEGATIVE    Comment: (NOTE) SARS-CoV-2 target nucleic acids are NOT DETECTED.  The SARS-CoV-2 RNA is generally detectable in upper respiratory specimens during the acute phase of infection. The lowest concentration of SARS-CoV-2 viral copies this assay can detect is 138 copies/mL. A negative result does not preclude SARS-Cov-2 infection and should not be used as the sole basis for treatment or other patient management decisions. A negative result may occur with  improper specimen collection/handling, submission of specimen other than nasopharyngeal swab, presence of viral mutation(s) within the areas targeted by this assay, and inadequate number of viral copies(<138 copies/mL). A negative result must be combined with clinical observations, patient history, and epidemiological information. The expected result is Negative.  Fact Sheet for Patients:  BloggerCourse.com  Fact Sheet for Healthcare Providers:  SeriousBroker.it  This test is no t yet approved or cleared by the [place] FDA and  has been authorized for detection and/or diagnosis of SARS-CoV-2 by FDA under an Emergency Use Authorization (EUA). This EUA will remain  in effect (meaning this test can be used) for the duration of the COVID-19 declaration under Section 564(b)(1) of the Act, 21 U.S.C.section 360bbb-3(b)(1), unless the authorization is terminated  or revoked sooner.       Influenza A by PCR NEGATIVE NEGATIVE   Influenza B by PCR NEGATIVE NEGATIVE    Comment: (NOTE) The Xpert Xpress SARS-CoV-2/FLU/RSV plus assay is intended as an aid in the diagnosis of influenza from Nasopharyngeal swab specimens and should not be used as a sole basis for  treatment. Nasal washings and aspirates are unacceptable for Xpert Xpress SARS-CoV-2/FLU/RSV testing.  Fact Sheet for Patients: BloggerCourse.com  Fact Sheet for Healthcare Providers: SeriousBroker.it  This test is not yet approved or cleared by the [place] FDA and has been authorized for detection and/or diagnosis of SARS-CoV-2 by FDA under an Emergency Use Authorization (EUA). This EUA will remain in effect (meaning this test can be used) for the duration of the COVID-19 declaration under Section 564(b)(1) of the Act, 21 U.S.C. section 360bbb-3(b)(1), unless the authorization is terminated or revoked.  Performed at [facility], [address] [place], [place] [address]   CBC     Status: Abnormal   Collection Time: 10/14/20  5:25 AM  Result Value Ref Range   WBC 10.9 (H) 4.0 - 10.5 K/uL   RBC 4.79 4.22 - 5.81 MIL/uL   Hemoglobin 15.5 13.0 - 17.0 g/dL   HCT 66.4 40.3 - 47.4 %   MCV 92.7 80.0 - 100.0 fL   MCH 32.4 26.0 - 34.0 pg   MCHC 34.9 30.0 - 36.0 g/dL   RDW 25.9 56.3 - 87.5 %   Platelets 216 150 - 400 K/uL   nRBC 0.0 0.0 - 0.2 %    Comment: Performed at [facility], [address] [place], [place] [address]  Basic metabolic panel     Status: Abnormal   Collection Time: 10/14/20  5:25 AM  Result Value Ref Range   Sodium 134 (L) 135 - 145 mmol/L   Potassium 3.7 3.5 - 5.1 mmol/L   Chloride 102 98 - 111 mmol/L   CO2 23 22 - 32 mmol/L   Glucose, Bld 124 (H) 70 - 99 mg/dL  Comment: Glucose reference range applies only to samples taken after fasting for at least 8 hours.   BUN 16 6 - 20 mg/dL   Creatinine, Ser 4.09 0.61 - 1.24 mg/dL   Calcium 8.7 (L) 8.9 - 10.3 mg/dL   GFR, Estimated >81 >19 mL/min    Comment: (NOTE) Calculated using the CKD-EPI Creatinine Equation (2021)    Anion gap 9 5 - 15    Comment: Performed at [facility], [address] [place], [place] [address]     CT ABDOMEN PELVIS W CONTRAST  Result Date: 10/13/2020 CLINICAL DATA:  Fever. Posterior abdominal wall cellulitis. Recent I&D of abscess. EXAM: CT ABDOMEN AND PELVIS WITH CONTRAST TECHNIQUE: Multidetector CT imaging of the abdomen and pelvis was performed using the standard protocol following bolus administration of intravenous contrast. CONTRAST:  OMNIPAQUE IOHEXOL 350 MG/ML SOLN COMPARISON:  08/28/2014 FINDINGS: Lower Chest: No acute findings. Hepatobiliary: No hepatic masses identified. Mild diffuse hepatic steatosis again seen. Gallbladder is unremarkable. No evidence of biliary ductal dilatation. Pancreas:  No mass or inflammatory changes. Spleen: Within normal limits in size and appearance. Adrenals/Urinary Tract: A few tiny sub-cm renal cysts remain stable. No masses identified. No evidence of ureteral calculi or hydronephrosis. Stomach/Bowel: No evidence of obstruction, inflammatory process or abnormal fluid collections. Normal appendix visualized. Diverticulosis is seen mainly involving the descending and sigmoid colon, however there is no evidence of diverticulitis. Vascular/Lymphatic: No pathologically enlarged lymph nodes. No acute vascular findings. Aortic atherosclerotic calcification noted. Reproductive:  No mass or other significant abnormality. Other: Small left inguinal hernia again seen which contains only fat. Edema in skin thickening is seen in the left posterior abdominal wall soft tissues. A small focus of increased attenuation and tiny air bubbles is seen in the subcutaneous tissues on image 34/2, consistent with site of recent I and D. no evidence of abscess. Musculoskeletal:  No suspicious bone lesions identified. IMPRESSION: Subcutaneous edema and skin thickening in left posterior abdominal wall, consistent with cellulitis. No evidence of abscess. Colonic diverticulosis, without radiographic evidence of diverticulitis. Stable small left inguinal hernia, which contains only  fat. Stable hepatic steatosis. Aortic Atherosclerosis (ICD10-I70.0). Electronically Signed   By: Danae Orleans M.D.   On: 10/13/2020 16:34    Review of Systems  Constitutional:  Positive for fever.  Musculoskeletal:  Positive for back pain.  Skin:  Positive for wound.  All other systems reviewed and are negative. Or as discussed in the history of present illness.  Blood pressure 129/87, pulse 78, temperature 98.1 F (36.7 C), resp. rate 17, height  (1.727 m), weight 113.3 kg, SpO2 94 %. Body mass index is 37.98 kg/m.  Physical Exam Constitutional:      General: He is not in acute distress.    Appearance: He is obese.  HENT:     Head: Normocephalic and atraumatic.     Nose: Nose normal.     Mouth/Throat:     Mouth: Mucous membranes are moist.  Eyes:     General: No scleral icterus.       Right eye: No discharge.        Left eye: No discharge.  Cardiovascular:     Rate and Rhythm: Normal rate and regular rhythm.  Pulmonary:     Effort: Pulmonary effort is normal. No respiratory distress.  Abdominal:     General: Abdomen is protuberant.     Palpations: Abdomen is soft.       Comments: Ink Jevan on abdomen delineating previous area  of erythema; erythema has significant Lee retreated.  Genitourinary:    Comments: Deferred Musculoskeletal:        General: No swelling or tenderness.  Skin:    General: Skin is warm and dry.          Comments: Ink Masato delineating area of prior erythema.  It has significantly retreated.  Purple marks indicate current erythematous site.  At the area closest to the midline back, there is a small incision with packing present.  There is old blood and some purulent drainage on the packing.  Neurological:     General: No focal deficit present.     Mental Status: He is alert and oriented to person, place, and time.  Psychiatric:        Mood and Affect: Mood normal.        Behavior: Behavior normal.    Assessment/Plan: This is a 60 year old  man who had an abscess on his back that was incised initially on Wednesday at urgent care.  He presented to the emergency department yesterday with fevers.  The site was further opened and drained by the emergency room provider.  CT imaging demonstrates no further drainable areas and the cellulitis has responded quite dramatically to IV antibiotics.  No need for further surgical intervention.  The dressing needs to be changed twice daily while he is in the hospital.  Would continue IV antibiotics until discharge and then changed to oral for a total of 7 days of therapy.  Continue packing the wound daily at home.  No need for outpatient follow-up with general surgery.  We will sign off.  Duanne Guess 10/14/2020, 11:50 AM

## 2020-10-14 NOTE — Plan of Care (Signed)
Patient is alert and oriented x 4, self care. Vss, afebrile, No c/o pain. Back has an incision site from an I&D of an abscess on Wednesday, Presented to the ER with fever, pain and redness at incision site. Started on IV abx. Will continue with the plan of care.  Problem: Education Goal: Knowledge of General Education information will improve Description: Including pain rating scale, medication(s)/side effects and non-pharmacologic comfort measures Outcome: Progressing   Problem: Health Behavior/Discharge Planning: Goal: Ability to manage health-related needs will improve Outcome: Progressing   Problem: Clinical Measurements: Goal: Ability to maintain clinical measurements within normal limits will improve Outcome: Progressing Goal: Will remain free from infection Outcome: Progressing Goal: Diagnostic test results will improve Outcome: Progressing Goal: Respiratory complications will improve Outcome: Progressing Goal: Cardiovascular complication will be avoided Outcome: Progressing   Problem: Activity: Goal: Risk for activity intolerance will decrease Outcome: Progressing   Problem: Nutrition: Goal: Adequate nutrition will be maintained Outcome: Progressing   Problem: Coping: Goal: Level of anxiety will decrease Outcome: Progressing   Problem: Elimination: Goal: Will not experience complications related to bowel motility Outcome: Progressing Goal: Will not experience complications related to urinary retention Outcome: Progressing   Problem: Pain Managment: Goal: General experience of comfort will improve Outcome: Progressing   Problem: Safety: Goal: Ability to remain free from injury will improve Outcome: Progressing   Problem: Clinical Measurements: Goal: Ability to avoid or minimize complications of infection will improve Outcome: Progressing   Problem: Skin Integrity: Goal: Skin integrity will improve Outcome: Progressing

## 2020-10-15 MED ORDER — BACID PO TABS: 2.0000 | ORAL_TABLET | Freq: Three times a day (TID) | ORAL | 0 refills | Status: AC

## 2020-10-15 MED ORDER — CEPHALEXIN 500 MG PO CAPS: 500.0000 mg | ORAL_CAPSULE | Freq: Three times a day (TID) | ORAL | 0 refills | Status: AC

## 2020-10-15 MED ORDER — RISAQUAD PO CAPS
1.0000 | ORAL_CAPSULE | Freq: Three times a day (TID) | ORAL | Status: DC
  Administered 2020-10-15: 1 via ORAL
  Filled 2020-10-15: qty 1

## 2020-10-15 MED ORDER — BACID PO TABS: 2.0000 | ORAL_TABLET | Freq: Three times a day (TID) | ORAL | Status: DC

## 2020-10-15 MED ORDER — OXYCODONE-ACETAMINOPHEN 5-325 MG PO TABS: 1.0000 | ORAL_TABLET | ORAL | 0 refills | Status: AC | PRN

## 2020-10-15 MED ORDER — POLYMYXIN B-TRIMETHOPRIM 10000-0.1 UNIT/ML-% OP SOLN: 1.0000 [drp] | Freq: Four times a day (QID) | OPHTHALMIC | 0 refills | Status: DC

## 2020-10-15 MED ORDER — POLYMYXIN B-TRIMETHOPRIM 10000-0.1 UNIT/ML-% OP SOLN
1.0000 [drp] | Freq: Four times a day (QID) | OPHTHALMIC | Status: DC
  Administered 2020-10-15: 1 [drp] via OPHTHALMIC
  Filled 2020-10-15: qty 10

## 2020-10-15 MED ORDER — LISINOPRIL 10 MG PO TABS: 10.0000 mg | ORAL_TABLET | Freq: Every day | ORAL | 0 refills | Status: AC

## 2020-10-15 NOTE — Discharge Summary (Addendum)
Ernest Tucker LHT:[id number] DOB: 1960/06/08 DOA: 10/13/2020  PCP: Rayetta Humphrey, MD  Admit date: 10/13/2020 Discharge date: 10/15/2020  Admitted From: home Disposition:  home  Recommendations for Outpatient Follow-up:  Follow up with PCP in 1 week Please obtain BMP/CBC in one week      Discharge Condition:Stable CODE STATUS:full  Diet recommendation: Heart Healthy Brief/Interim Summary: Per HPI: Ernest Tucker is a 60 y.o. male with medical history significant for obesity, hypertension, depression who presents to the emergency room for evaluation of fever. Patient was seen at the urgent care center 2 days ago for evaluation of a bump on his left lumbar area.  He had attempted to drain it at home but noticed that it progressively got bigger in size and he also had a fever associated with chills.  He had a T-max of 101.5.  He complained about pain involving his left lumbar area associated with redness and differential warmth.  Examination revealed an abscess which was drained at the urgent care center.  Patient was offered admission to the hospital due to concerns for possible bacteremia which he declined.  He was given a dose of IM Rocephin and discharged on Bactrim and tramadol as needed for pain and advised to go to the ER if his pain worsens or if he continues to have a fever. According to his wife he did well for 24 hours but started having fevers again on the day of admission, he had a T-max of 101.40F.  He took some Tylenol at home and presented to the emergency room. The physician assistant in the emergency room attempted to drain the abscess and packed it with iodoform gauze. Respiratory viral panel is negative He was admitted to the hospital started on iv antibiotics. General surgery consulted. CT of the abdomen and pelvis revealed Subcutaneous edema and skin thickening in left posterior abdominal wall, consistent with cellulitis. No evidence of abscess. General surgery saw the  patient did not feel patient needed further surgical intervention.  Just treated with IV antibiotics until discharge and continue to oral p.o. for total of 7 days of therapy.  Continue packing the wound daily at home.  No need for outpatient surgical follow-up.   Abscess /cellulitis of back. Patient presents for evaluation of fever with a T-max of 101.5 and marked leukocytosis  Was I&D in the ER.  CT imaging demonstrated no further drainable area of cellulitis has responded well to IV antibiotics.  No further surgical intervention.  Started on IV vancomycin to medication then switched to p.o. and on discharge.  Keflex was okayed by surgery to be used as outpatient to complete 7-day course. continue packing the wound daily at home.  No need for outpatient follow-up with general surgery. Follow-up with PCP for further management     Hypertension Continue lisinopril, dose decreased to 10 mg daily Follow-up with PCP for further management   Depression Continue Celexa   Rt eye conjuctivitis- started this am. Stated he was rubbing eyes.  Eyes red and feels griddy. Start opthl. Eye drops.  Discharge Diagnoses:  Principal Problem:   Abscess of back Active Problems:   Prediabetes   Essential hypertension    Discharge Instructions  Discharge Instructions     Call MD for:  temperature >100.4   Complete by: As directed    Diet - low sodium heart healthy   Complete by: As directed    Discharge wound care:   Complete by: As directed    Pack wound daily  Increase activity slowly   Complete by: As directed       Allergies as of 10/15/2020       Reactions   Other Swelling   NUTS NUTS        Medication List     STOP taking these medications    sulfamethoxazole-trimethoprim 800-160 MG tablet Commonly known as: BACTRIM DS   traMADol 50 MG tablet Commonly known as: ULTRAM       TAKE these medications    aspirin EC 81 MG tablet Take 81 mg by mouth daily.    atorvastatin 10 MG tablet Commonly known as: LIPITOR Take 10 mg by mouth daily.   cephALEXin 500 MG capsule Commonly known as: KEFLEX Take 1 capsule (500 mg total) by mouth 3 (three) times daily for 6 days.   citalopram 20 MG tablet Commonly known as: CELEXA Take 20 mg by mouth daily.   lactobacillus acidophilus Tabs tablet Take 2 tablets by mouth 3 (three) times daily for 6 days.   lisinopril 10 MG tablet Commonly known as: ZESTRIL Take 1 tablet (10 mg total) by mouth daily. What changed:  medication strength how much to take   multivitamin tablet Take 1 tablet by mouth daily.   oxyCODONE-acetaminophen 5-325 MG tablet Commonly known as: PERCOCET/ROXICET Take 1 tablet by mouth every 4 (four) hours as needed for up to 2 days for moderate pain.   trimethoprim-polymyxin b ophthalmic solution Commonly known as: POLYTRIM Place 1 drop into the right eye every 6 (six) hours.               Discharge Care Instructions  (From admission, onward)           Start     Ordered   10/15/20 0000  Discharge wound care:       Comments: Pack wound daily   10/15/20 1208            Follow-up Information     Rayetta HumphreyGeorge, Sionne A, MD Follow up in 1 week(s).   Specialty: Family Medicine Contact information: [address] [place] [id number] [phone]                Allergies  Allergen Reactions   Other Swelling    NUTS NUTS     Consultations: General surgery   Procedures/Studies: CT ABDOMEN PELVIS W CONTRAST  Result Date: 10/13/2020 CLINICAL DATA:  Fever. Posterior abdominal wall cellulitis. Recent I&D of abscess. EXAM: CT ABDOMEN AND PELVIS WITH CONTRAST TECHNIQUE: Multidetector CT imaging of the abdomen and pelvis was performed using the standard protocol following bolus administration of intravenous contrast. CONTRAST:  100mL OMNIPAQUE IOHEXOL 350 MG/ML SOLN COMPARISON:  08/28/2014 FINDINGS: Lower Chest: No acute findings. Hepatobiliary: No  hepatic masses identified. Mild diffuse hepatic steatosis again seen. Gallbladder is unremarkable. No evidence of biliary ductal dilatation. Pancreas:  No mass or inflammatory changes. Spleen: Within normal limits in size and appearance. Adrenals/Urinary Tract: A few tiny sub-cm renal cysts remain stable. No masses identified. No evidence of ureteral calculi or hydronephrosis. Stomach/Bowel: No evidence of obstruction, inflammatory process or abnormal fluid collections. Normal appendix visualized. Diverticulosis is seen mainly involving the descending and sigmoid colon, however there is no evidence of diverticulitis. Vascular/Lymphatic: No pathologically enlarged lymph nodes. No acute vascular findings. Aortic atherosclerotic calcification noted. Reproductive:  No mass or other significant abnormality. Other: Small left inguinal hernia again seen which contains only fat. Edema in skin thickening is seen in the left posterior abdominal wall soft tissues. A small  focus of increased attenuation and tiny air bubbles is seen in the subcutaneous tissues on image 34/2, consistent with site of recent I and D. no evidence of abscess. Musculoskeletal:  No suspicious bone lesions identified. IMPRESSION: Subcutaneous edema and skin thickening in left posterior abdominal wall, consistent with cellulitis. No evidence of abscess. Colonic diverticulosis, without radiographic evidence of diverticulitis. Stable small left inguinal hernia, which contains only fat. Stable hepatic steatosis. Aortic Atherosclerosis (ICD10-I70.0). Electronically Signed   By: Danae Orleans M.D.   On: 10/13/2020 16:34   US Abdomen Limited RUQ (LIVER/GB)  Result Date: 10/06/2020 CLINICAL DATA:  Elevated LFTs. EXAM: ULTRASOUND ABDOMEN LIMITED RIGHT UPPER QUADRANT COMPARISON:  Right upper quadrant ultrasound dated 01/09/2018. FINDINGS: Gallbladder: No gallstones or wall thickening visualized. No sonographic Murphy sign noted by sonographer. Common bile  duct: Diameter: 3 mm Liver: The liver is enlarged measuring 19 cm in length. There is diffuse increased liver echogenicity most commonly seen in the setting of fatty infiltration. Superimposed inflammation or fibrosis is not excluded. Clinical correlation is recommended. Portal vein is patent on color Doppler imaging with normal direction of blood flow towards the liver. Other: None. IMPRESSION: Enlarged fatty liver. Clinical correlation is recommended to evaluate for steatohepatitis. Electronically Signed   By: Elgie Collard M.D.   On: 10/06/2020 13:44      Subjective: Feels much better.  Denies any pain this AM.  Would like to go home to see his grandson.  Discharge Exam: Vitals:   10/15/20 0757 10/15/20 1137  BP: 138/90 124/78  Pulse: 69 74  Resp: 18 19  Temp: 98 F (36.7 C) 98 F (36.7 C)  SpO2: 100% 97%   Vitals:   10/14/20 2059 10/15/20 0459 10/15/20 0757 10/15/20 1137  BP: 114/74 122/86 138/90 124/78  Pulse: 71 69 69 74  Resp: Temp: 98.2 F (36.8 C) 98 F (36.7 C) 98 F (36.7 C) 98 F (36.7 C)  TempSrc: Oral Oral Oral Oral  SpO2: 98% 97% 100% 97%  Weight:      Height:        General: Pt is alert, awake, not in acute distress Cardiovascular: RRR, S1/S2 +, no rubs, no gallops Respiratory: CTA bilaterally, no wheezing, no rhonchi Back: Minimal erythema.  Swelling down.  Decrease in warmth overall looks much better. Abdominal: Soft, NT, ND, bowel sounds + Extremities: no edema, no cyanosis    The results of significant diagnostics from this hospitalization (including imaging, microbiology, ancillary and laboratory) are listed below for reference.     Microbiology: Recent Results (from the past 240 hour(s))  Blood culture (routine single)     Status: None (Preliminary result)   Collection Time: 10/13/20 12:10 PM   Specimen: BLOOD  Result Value Ref Range Status   Specimen Description BLOOD BLOOD LEFT ARM  Final   Special Requests   Final     BOTTLES DRAWN AEROBIC AND ANAEROBIC Blood Culture adequate volume   Culture   Final    NO GROWTH 2 DAYS Performed at [facility], [address] [place], [place] [address]    Report Status PENDING  Incomplete  Resp Panel by RT-PCR (Flu A&B, Covid) Nasopharyngeal Swab     Status: None   Collection Time: 10/13/20  1:15 PM   Specimen: Nasopharyngeal Swab; Nasopharyngeal(NP) swabs in vial transport medium  Result Value Ref Range Status   SARS Coronavirus 2 by RT PCR NEGATIVE NEGATIVE Final    Comment: (NOTE) SARS-CoV-2 target nucleic acids are  NOT DETECTED.  The SARS-CoV-2 RNA is generally detectable in upper respiratory specimens during the acute phase of infection. The lowest concentration of SARS-CoV-2 viral copies this assay can detect is 138 copies/mL. A negative result does not preclude SARS-Cov-2 infection and should not be used as the sole basis for treatment or other patient management decisions. A negative result may occur with  improper specimen collection/handling, submission of specimen other than nasopharyngeal swab, presence of viral mutation(s) within the areas targeted by this assay, and inadequate number of viral copies(<138 copies/mL). A negative result must be combined with clinical observations, patient history, and epidemiological information. The expected result is Negative.  Fact Sheet for Patients:  BloggerCourse.com  Fact Sheet for Healthcare Providers:  SeriousBroker.it  This test is no t yet approved or cleared by the [place] FDA and  has been authorized for detection and/or diagnosis of SARS-CoV-2 by FDA under an Emergency Use Authorization (EUA). This EUA will remain  in effect (meaning this test can be used) for the duration of the COVID-19 declaration under Section 564(b)(1) of the Act, 21 U.S.C.section 360bbb-3(b)(1), unless the authorization is terminated  or revoked sooner.        Influenza A by PCR NEGATIVE NEGATIVE Final   Influenza B by PCR NEGATIVE NEGATIVE Final    Comment: (NOTE) The Xpert Xpress SARS-CoV-2/FLU/RSV plus assay is intended as an aid in the diagnosis of influenza from Nasopharyngeal swab specimens and should not be used as a sole basis for treatment. Nasal washings and aspirates are unacceptable for Xpert Xpress SARS-CoV-2/FLU/RSV testing.  Fact Sheet for Patients: BloggerCourse.com  Fact Sheet for Healthcare Providers: SeriousBroker.it  This test is not yet approved or cleared by the [place] FDA and has been authorized for detection and/or diagnosis of SARS-CoV-2 by FDA under an Emergency Use Authorization (EUA). This EUA will remain in effect (meaning this test can be used) for the duration of the COVID-19 declaration under Section 564(b)(1) of the Act, 21 U.S.C. section 360bbb-3(b)(1), unless the authorization is terminated or revoked.  Performed at [facility], [address] [place], [place] [address]      Labs: BNP (last 3 results) No results for input(s): BNP in the last 8760 hours. Basic Metabolic Panel: Recent Labs  Lab 10/13/20 1052 10/14/20 0525  NA 132* 134*  K 3.8 3.7  CL 101 102  CO2 21* 23  GLUCOSE 140* 124*  BUN 18 16  CREATININE 1.04 1.03  CALCIUM 8.8* 8.7*   Liver Function Tests: Recent Labs  Lab 10/13/20 1052  AST 18  ALT 27  ALKPHOS 59  BILITOT 1.0  PROT 7.9  ALBUMIN 3.5   No results for input(s): LIPASE, AMYLASE in the last 168 hours. No results for input(s): AMMONIA in the last 168 hours. CBC: Recent Labs  Lab 10/13/20 1052 10/14/20 0525  WBC 16.0* 10.9*  NEUTROABS 12.5*  --   HGB 16.0 15.5  HCT 45.9 44.4  MCV 92.7 92.7  PLT 211 216   Cardiac Enzymes: No results for input(s): CKTOTAL, CKMB, CKMBINDEX, TROPONINI in the last 168 hours. BNP: Invalid input(s): POCBNP CBG: No results for input(s): GLUCAP in the  last 168 hours. D-Dimer No results for input(s): DDIMER in the last 72 hours. Hgb A1c No results for input(s): HGBA1C in the last 72 hours. Lipid Profile No results for input(s): CHOL, HDL, LDLCALC, TRIG, CHOLHDL, LDLDIRECT in the last 72 hours. Thyroid function studies No results for input(s): TSH, T4TOTAL, T3FREE, THYROIDAB in the last 72  hours.  Invalid input(s): FREET3 Anemia work up No results for input(s): VITAMINB12, FOLATE, FERRITIN, TIBC, IRON, RETICCTPCT in the last 72 hours. Urinalysis    Component Value Date/Time   COLORURINE AMBER (A) 10/13/2020 1052   APPEARANCEUR HAZY (A) 10/13/2020 1052   LABSPEC 1.032 (H) 10/13/2020 1052   PHURINE 5.0 10/13/2020 1052   GLUCOSEU NEGATIVE 10/13/2020 1052   HGBUR SMALL (A) 10/13/2020 1052   BILIRUBINUR NEGATIVE 10/13/2020 1052   KETONESUR 20 (A) 10/13/2020 1052   PROTEINUR 30 (A) 10/13/2020 1052   NITRITE NEGATIVE 10/13/2020 1052   LEUKOCYTESUR NEGATIVE 10/13/2020 1052   Sepsis Labs Invalid input(s): PROCALCITONIN,  WBC,  LACTICIDVEN Microbiology Recent Results (from the past 240 hour(s))  Blood culture (routine single)     Status: None (Preliminary result)   Collection Time: 10/13/20 12:10 PM   Specimen: BLOOD  Result Value Ref Range Status   Specimen Description BLOOD BLOOD LEFT ARM  Final   Special Requests   Final    BOTTLES DRAWN AEROBIC AND ANAEROBIC Blood Culture adequate volume   Culture   Final    NO GROWTH 2 DAYS Performed at [facility], [address] [place], [place] [address]    Report Status PENDING  Incomplete  Resp Panel by RT-PCR (Flu A&B, Covid) Nasopharyngeal Swab     Status: None   Collection Time: 10/13/20  1:15 PM   Specimen: Nasopharyngeal Swab; Nasopharyngeal(NP) swabs in vial transport medium  Result Value Ref Range Status   SARS Coronavirus 2 by RT PCR NEGATIVE NEGATIVE Final    Comment: (NOTE) SARS-CoV-2 target nucleic acids are NOT DETECTED.  The SARS-CoV-2 RNA is generally  detectable in upper respiratory specimens during the acute phase of infection. The lowest concentration of SARS-CoV-2 viral copies this assay can detect is 138 copies/mL. A negative result does not preclude SARS-Cov-2 infection and should not be used as the sole basis for treatment or other patient management decisions. A negative result may occur with  improper specimen collection/handling, submission of specimen other than nasopharyngeal swab, presence of viral mutation(s) within the areas targeted by this assay, and inadequate number of viral copies(<138 copies/mL). A negative result must be combined with clinical observations, patient history, and epidemiological information. The expected result is Negative.  Fact Sheet for Patients:  BloggerCourse.com  Fact Sheet for Healthcare Providers:  SeriousBroker.it  This test is no t yet approved or cleared by the [place] FDA and  has been authorized for detection and/or diagnosis of SARS-CoV-2 by FDA under an Emergency Use Authorization (EUA). This EUA will remain  in effect (meaning this test can be used) for the duration of the COVID-19 declaration under Section 564(b)(1) of the Act, 21 U.S.C.section 360bbb-3(b)(1), unless the authorization is terminated  or revoked sooner.       Influenza A by PCR NEGATIVE NEGATIVE Final   Influenza B by PCR NEGATIVE NEGATIVE Final    Comment: (NOTE) The Xpert Xpress SARS-CoV-2/FLU/RSV plus assay is intended as an aid in the diagnosis of influenza from Nasopharyngeal swab specimens and should not be used as a sole basis for treatment. Nasal washings and aspirates are unacceptable for Xpert Xpress SARS-CoV-2/FLU/RSV testing.  Fact Sheet for Patients: BloggerCourse.com  Fact Sheet for Healthcare Providers: SeriousBroker.it  This test is not yet approved or cleared by the [place] FDA  and has been authorized for detection and/or diagnosis of SARS-CoV-2 by FDA under an Emergency Use Authorization (EUA). This EUA will remain in effect (meaning this test can be  used) for the duration of the COVID-19 declaration under Section 564(b)(1) of the Act, 21 U.S.C. section 360bbb-3(b)(1), unless the authorization is terminated or revoked.  Performed at [facility], [address], [place], [place] [address]      Time coordinating discharge: Over 30 minutes  SIGNED:   Lynn Ito, MD  [facility] 10/15/2020, 12:11 PM Pager   If 7PM-7AM, please contact night-coverage [email or web address] Password [email or web address]

## 2020-10-15 NOTE — Progress Notes (Signed)
Discharge note: Reviewed discharge instructions with pt. Reviewed wound dressing with pt and wife. Pt and wife verbalized understanding. PT discharged with all personal belongings. IV cath intact upon removal. Staff wheeled pt out.  PT transported to home via family vehicle.

## 2020-10-15 NOTE — Progress Notes (Signed)
Patient given lovenox around 2235. Around 0330 patient complained of a headache and right eye pain. PRN percocet administered. Approximately 0430 patient states that he noted a headache and eye pain within 1 hour of receiving the lovenox injection, but did not mention it. Right eye noted to be red and drooping. Dr. Para March notified.

## 2020-10-18 LAB — CULTURE, BLOOD (SINGLE)
Culture: NO GROWTH
Special Requests: ADEQUATE

## 2021-07-29 ENCOUNTER — Ambulatory Visit
Admission: EM | Admit: 2021-07-29 | Discharge: 2021-07-29 | Disposition: A | Discharge status: Home / Self Care | Payer: Managed Care, Other (non HMO) | Attending: Emergency Medicine | Admitting: Emergency Medicine

## 2021-07-29 ENCOUNTER — Ambulatory Visit (INDEPENDENT_AMBULATORY_CARE_PROVIDER_SITE_OTHER): Payer: Managed Care, Other (non HMO)

## 2021-07-29 ENCOUNTER — Encounter: Payer: Self-pay | Admitting: Emergency Medicine

## 2021-07-29 DIAGNOSIS — M25522 Pain in left elbow: Secondary | ICD-10-CM | POA: Diagnosis not present

## 2021-07-29 DIAGNOSIS — M7712 Lateral epicondylitis, left elbow: Secondary | ICD-10-CM

## 2021-07-29 IMAGING — CR DG BONE LENGTH
1 series · 5 of 5 positions shown · non-contrast
Comparison: None.

CLINICAL DATA: Chronic left knee pain. Evaluation of mechanical
alignment.

EXAM:
BONE LENGTH

[Series 1: dg bone length · 0.14mm/px · 5 of 5 slices shown]
[im 1/5]
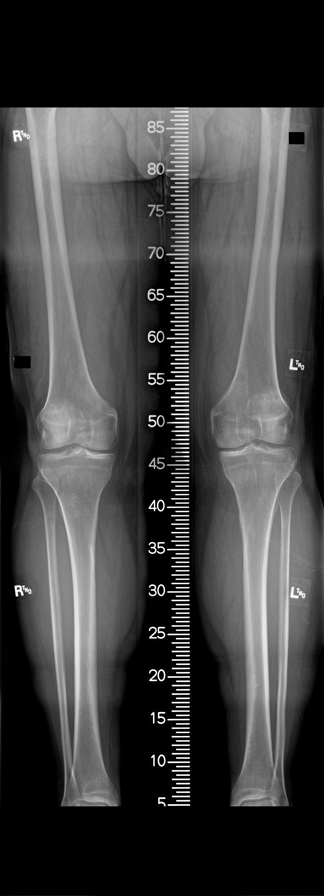
[im 2/5]
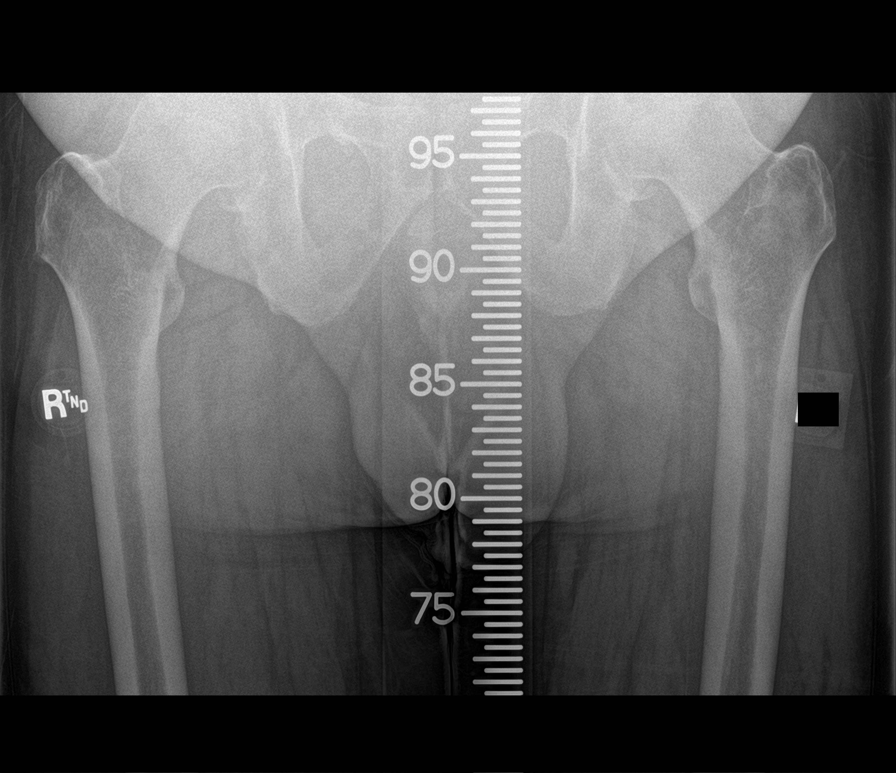
[im 3/5]
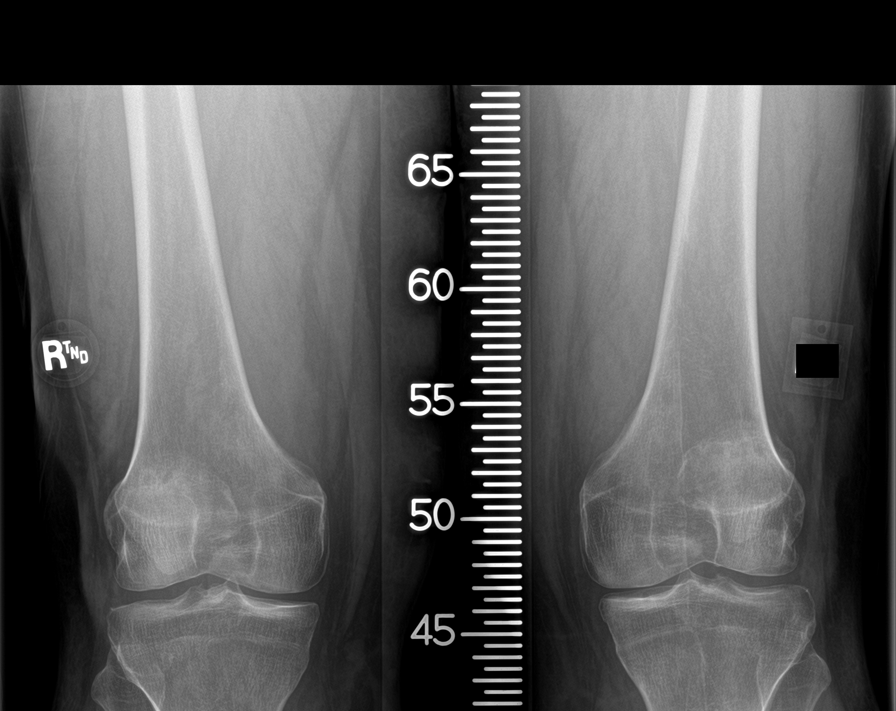
[im 4/5]
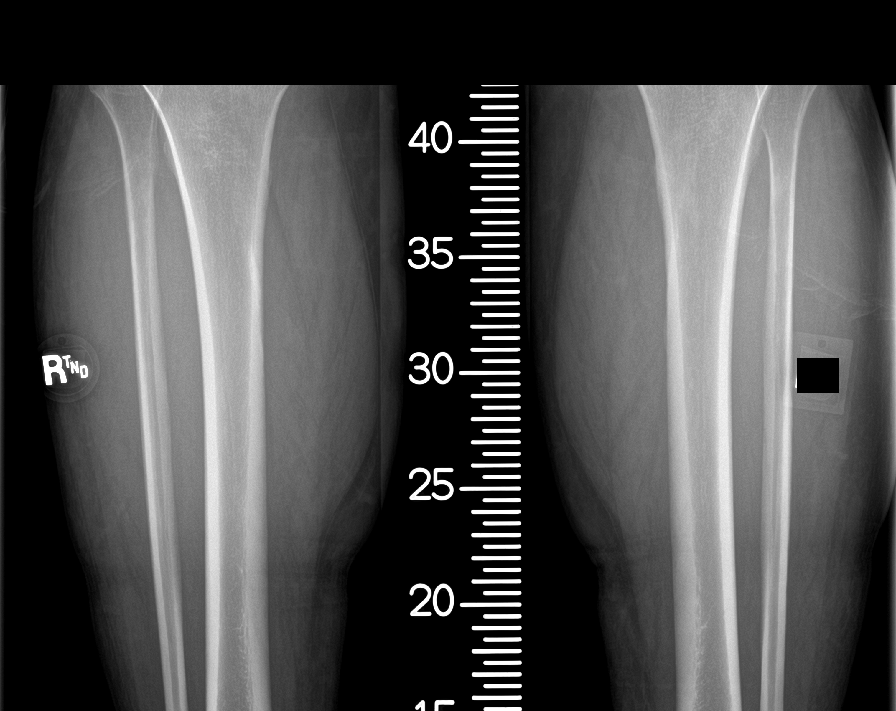
[im 5/5]
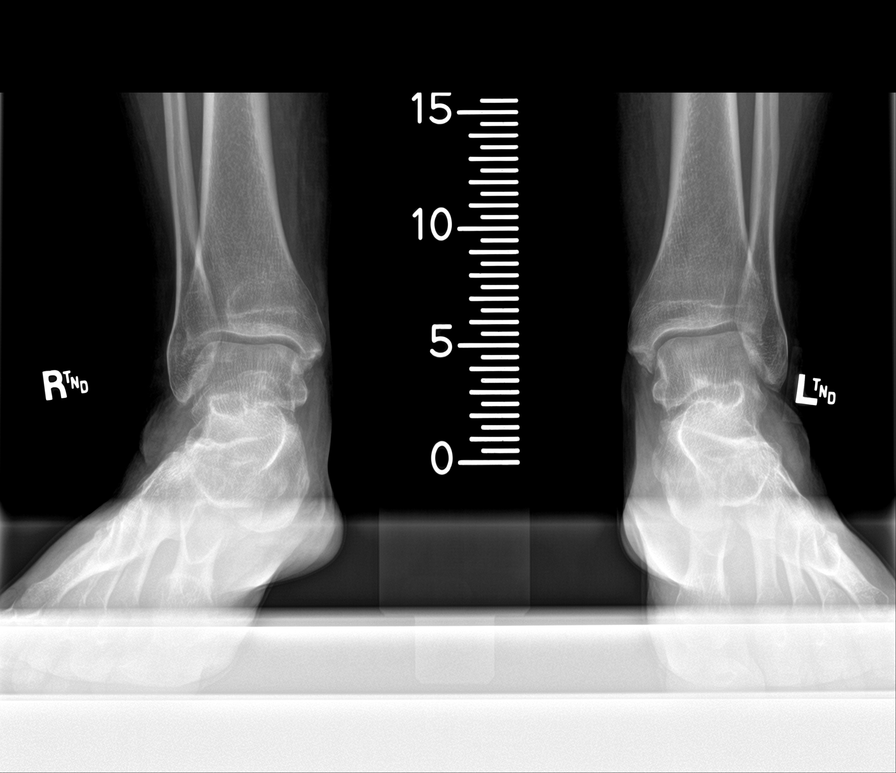

[5 of 5 positions shown; findings below may reference images not displayed]

FINDINGS: The height of the top of the femoral heads is equivalent on the
right and left.

Left femur length is 52 cm. Right femur length is 52.2 cm.

Left tibial length is 41.3 cm. Right tibial length is 41.4 cm.
IMPRESSION: Left leg length is 93.3 cm.

Right leg length is 93.6 cm.

Alignment is bilaterally symmetrical and normal.

## 2021-07-29 MED ORDER — PREDNISONE 10 MG (21) PO TBPK: ORAL_TABLET | ORAL | 0 refills | Status: DC

## 2021-07-29 NOTE — ED Triage Notes (Signed)
Patient c/o left elbow pain that started 3-4 days.  Patient denies injury or fall.

## 2021-07-29 NOTE — ED Provider Notes (Signed)
[facility]    CSN: [id number] Arrival date & time: 07/29/21  1206      History   Chief Complaint Chief Complaint  Patient presents with   Elbow Pain    left    HPI Ernest Tucker is a 61 y.o. male.   HPI  61 year old male here for evaluation of left elbow pain.  Patient ports that has been having pain in the outside of his left elbow for the past 3 to 4 days.  He states that the pain is only there if he flexes his arm and it will occasionally radiate up the the back of his left upper arm.  He denies any injury.  No numbness or tingling in his fingers.  No weakness in grip.  Past Medical History:  Diagnosis Date   Hypertension    Kidney stones     Patient Active Problem List   Diagnosis Date Noted   Abscess of back 10/13/2020   Chest pain with moderate risk for cardiac etiology 01/13/2018   Leg edema 01/11/2018   Hyperlipidemia 01/11/2018   Prediabetes 01/11/2018   Essential hypertension 01/11/2018    Past Surgical History:  Procedure Laterality Date   NO PAST SURGERIES         Home Medications    Prior to Admission medications   Medication Sig Start Date End Date Taking? Authorizing Provider  aspirin EC 81 MG tablet Take 81 mg by mouth daily.   Yes [provider]  citalopram (CELEXA) 20 MG tablet Take 20 mg by mouth daily. 09/19/20  Yes [provider]  lisinopril (ZESTRIL) 10 MG tablet Take 1 tablet (10 mg total) by mouth daily. 10/15/20 07/29/21 Yes Lynn ItoAmery, Sahar, MD  Multiple Vitamin (MULTIVITAMIN) tablet Take 1 tablet by mouth daily.   Yes [provider]  predniSONE (STERAPRED UNI-PAK 21 TAB) 10 MG (21) TBPK tablet Take 6 tablets on day 1, 5 tablets day 2, 4 tablets day 3, 3 tablets day 4, 2 tablets day 5, 1 tablet day 6 07/29/21  Yes Becky Augustayan, Roseline Ebarb, NP    Family History Family History  Problem Relation Age of Onset   COPD Mother    Heart attack Father     Social History Social History   Tobacco Use   Smoking  status: Never   Smokeless tobacco: Never  Vaping Use   Vaping Use: Never used  Substance Use Topics   Alcohol use: Yes    Alcohol/week: 3.0 standard drinks    Types: 3 Cans of beer per week    Comment: on the weekends   Drug use: No     Allergies   Other   Review of Systems Review of Systems  Constitutional:  Negative for fever.  Musculoskeletal:  Positive for arthralgias. Negative for joint swelling and myalgias.  Neurological:  Negative for weakness and numbness.  Hematological: Negative.   Psychiatric/Behavioral: Negative.      Physical Exam Triage Vital Signs ED Triage Vitals  Enc Vitals Group     BP 07/29/21 1328 (!) 154/111     Pulse Rate 07/29/21 1328 82     Resp 07/29/21 1328 15     Temp 07/29/21 1328 98.5 F (36.9 C)     Temp Source 07/29/21 1328 Oral     SpO2 07/29/21 1328 97 %     Weight 07/29/21 1326 249 lb 12.5 oz (113.3 kg)     Height 07/29/21 1326 5\' 8"  (1.727 m)     Head Circumference --  Peak Flow --      Pain Score 07/29/21 1326 7     Pain Loc --      Pain Edu? --      Excl. in [place]? --    No data found.  Updated Vital Signs BP (!) 154/111 (BP Location: Right Arm)   Pulse 82   Temp 98.5 F (36.9 C) (Oral)   Resp 15   Ht 5\' 8"  (1.727 m)   Wt 249 lb 12.5 oz (113.3 kg)   SpO2 97%   BMI 37.98 kg/m   Visual Acuity Right Eye Distance:   Left Eye Distance:   Bilateral Distance:    Right Eye Near:   Left Eye Near:    Bilateral Near:     Physical Exam Vitals and nursing note reviewed.  Constitutional:      Appearance: Normal appearance. He is not ill-appearing.  HENT:     Head: Normocephalic and atraumatic.  Musculoskeletal:        General: Tenderness present. No swelling, deformity or signs of injury. Normal range of motion.  Skin:    General: Skin is warm and dry.     Capillary Refill: Capillary refill takes less than 2 seconds.     Findings: No bruising or erythema.  Neurological:     General: No focal deficit present.      Mental Status: He is alert and oriented to person, place, and time.     Sensory: No sensory deficit.     Motor: No weakness.  Psychiatric:        Mood and Affect: Mood normal.        Behavior: Behavior normal.        Thought Content: Thought content normal.        Judgment: Judgment normal.     UC Treatments / Results  Labs (all labs ordered are listed, but only abnormal results are displayed) Labs Reviewed - No data to display  EKG   Radiology No results found.  Procedures Procedures (including critical care time)  Medications Ordered in UC Medications - No data to display  Initial Impression / Assessment and Plan / UC Course  I have reviewed the triage vital signs and the nursing notes.  Pertinent labs & imaging results that were available during my care of the patient were reviewed by me and considered in my medical decision making (see chart for details).  Patient is a very pleasant, nontoxic-appearing 61 year old male here for evaluation of 3 to 4 days worth of left elbow pain that is not associated with injury.  The pain is in the outside of his left elbow near the ulnar groove and will only occur with range of motion of the elbow.  It is not present at rest.  It will occasionally radiate up the back of the left arm as well but not always.  Patient denies any numbness or tingling in his fingers or weakness in his grip.  On exam patient's left elbow is in normal anatomical alignment.  His radial and ulnar pulses are 2+ and his grip in his left hand is 5/5.  Cap refills less than 2 seconds.  Patient does have tenderness with palpation of the lateral epicondyle as well as moving medially into the ulnar groove.  No pain with palpation of the olecranon process or with palpation of the triceps muscle group.  There is no erythema or edema noted.  No pain or tenderness with palpation of the muscles of the forearm.  We will obtain radiograph of left elbow.  Left elbow x-rays  independently reviewed and evaluated by me.  Impression: There is no evidence of fracture or dislocation.  There is a bone spur coming off the back of the olecranon process.  Radiology overread is pending.  I will discharge patient home with a diagnosis of lateral epicondylitis and treated with a prednisone pack, compression sleeve, and home physical therapy.  He does see Dr. Monday [facility] but he was unable to see him this week.  He is going to make an appointment to see him in the future.  I have advised him that if the pain does not improve with the measures I prescribed he would need to follow-up with orthopedics anyway and I have encouraged him to go ahead and make an appointment.   Final Clinical Impressions(s) / UC Diagnoses   Final diagnoses:  Lateral epicondylitis of left elbow     Discharge Instructions      Start the prednisone tomorrow morning and take it each morning at breakfast.  Wear a compression sleeve to give support.  Ice your elbow for 20 minutes at a time, 2-3 times a day to help with inflammation.  Follow the home physical therapy exercises given at discharge.  Return for re-evaluation of new or worsening symptoms.      ED Prescriptions     Medication Sig Dispense Auth. Provider   predniSONE (STERAPRED UNI-PAK 21 TAB) 10 MG (21) TBPK tablet Take 6 tablets on day 1, 5 tablets day 2, 4 tablets day 3, 3 tablets day 4, 2 tablets day 5, 1 tablet day 6 21 tablet Becky Augusta, NP      PDMP not reviewed this encounter.   Becky Augusta, NP 07/29/21 1357

## 2021-07-29 NOTE — Discharge Instructions (Signed)
Start the prednisone tomorrow morning and take it each morning at breakfast.  Wear a compression sleeve to give support.  Ice your elbow for 20 minutes at a time, 2-3 times a day to help with inflammation.  Follow the home physical therapy exercises given at discharge.  Return for re-evaluation of new or worsening symptoms.

## 2021-09-04 ENCOUNTER — Ambulatory Visit
Admission: EM | Admit: 2021-09-04 | Discharge: 2021-09-04 | Disposition: A | Discharge status: Home / Self Care | Payer: Managed Care, Other (non HMO) | Attending: Emergency Medicine | Admitting: Emergency Medicine

## 2021-09-04 DIAGNOSIS — S29019A Strain of muscle and tendon of unspecified wall of thorax, initial encounter: Secondary | ICD-10-CM

## 2021-09-04 MED ORDER — PREDNISONE 10 MG (21) PO TBPK: ORAL_TABLET | ORAL | 0 refills | Status: DC

## 2021-09-04 MED ORDER — BACLOFEN 10 MG PO TABS: 10.0000 mg | ORAL_TABLET | Freq: Three times a day (TID) | ORAL | 0 refills | Status: AC

## 2021-09-04 NOTE — ED Provider Notes (Signed)
[facility]    CSN: [id number] Arrival date & time: 09/04/21  1913      History   Chief Complaint Chief Complaint  Patient presents with   Back Pain    HPI Ernest Tucker is a 61 y.o. male.   HPI  44-year-old male here for evaluation of right-sided back pain.  Patient reports that he is experiencing pain in the right side of his back for the past week.  This pain increases with deep breathing and movement.  He does not remember any discrete injury but he does do a lot of manual labor.  He denies any urinary symptoms such as pain with urination or urinary urgency or frequency.  He has been taking ibuprofen at home which has been helping to a mild degree.  Past Medical History:  Diagnosis Date   Hypertension    Kidney stones     Patient Active Problem List   Diagnosis Date Noted   Abscess of back 10/13/2020   Chest pain with moderate risk for cardiac etiology 01/13/2018   Leg edema 01/11/2018   Hyperlipidemia 01/11/2018   Prediabetes 01/11/2018   Essential hypertension 01/11/2018    Past Surgical History:  Procedure Laterality Date   NO PAST SURGERIES         Home Medications    Prior to Admission medications   Medication Sig Start Date End Date Taking? Authorizing Provider  aspirin EC 81 MG tablet Take 81 mg by mouth daily.   Yes [provider]  baclofen (LIORESAL) 10 MG tablet Take 1 tablet (10 mg total) by mouth 3 (three) times daily. 09/04/21  Yes Becky Augusta, NP  citalopram (CELEXA) 20 MG tablet Take 20 mg by mouth daily. 09/19/20  Yes [provider]  Multiple Vitamin (MULTIVITAMIN) tablet Take 1 tablet by mouth daily.   Yes [provider]  predniSONE (STERAPRED UNI-PAK 21 TAB) 10 MG (21) TBPK tablet Take 6 tablets on day 1, 5 tablets day 2, 4 tablets day 3, 3 tablets day 4, 2 tablets day 5, 1 tablet day 6 09/04/21  Yes Becky Augusta, NP  lisinopril (ZESTRIL) 10 MG tablet Take 1 tablet (10 mg total) by mouth daily.  10/15/20 07/29/21  Lynn Ito, MD    Family History Family History  Problem Relation Age of Onset   COPD Mother    Heart attack Father     Social History Social History   Tobacco Use   Smoking status: Never   Smokeless tobacco: Never  Vaping Use   Vaping Use: Never used  Substance Use Topics   Alcohol use: Yes    Alcohol/week: 3.0 standard drinks of alcohol    Types: 3 Cans of beer per week    Comment: on the weekends   Drug use: No     Allergies   Other   Review of Systems Review of Systems  Genitourinary:  Negative for dysuria, frequency and urgency.  Musculoskeletal:  Positive for back pain.  Hematological: Negative.   Psychiatric/Behavioral: Negative.       Physical Exam Triage Vital Signs ED Triage Vitals  Enc Vitals Group     BP 09/04/21 1926 (!) 135/94     Pulse Rate 09/04/21 1926 90     Resp 09/04/21 1926 18     Temp 09/04/21 1926 98.6 F (37 C)     Temp Source 09/04/21 1926 Oral     SpO2 09/04/21 1926 96 %     Weight 09/04/21 1924 265 lb (  120.2 kg)     Height 09/04/21 1924 5\' 9"  (1.753 m)     Head Circumference --      Peak Flow --      Pain Score 09/04/21 1924 9     Pain Loc --      Pain Edu? --      Excl. in [place]? --    No data found.  Updated Vital Signs BP (!) 135/94 (BP Location: Left Arm)   Pulse 90   Temp 98.6 F (37 C) (Oral)   Resp 18   Ht 5\' 9"  (1.753 m)   Wt 265 lb (120.2 kg)   SpO2 96%   BMI 39.13 kg/m   Visual Acuity Right Eye Distance:   Left Eye Distance:   Bilateral Distance:    Right Eye Near:   Left Eye Near:    Bilateral Near:     Physical Exam Vitals and nursing note reviewed.  Constitutional:      Appearance: Normal appearance. He is not ill-appearing.  HENT:     Head: Normocephalic and atraumatic.  Cardiovascular:     Rate and Rhythm: Normal rate and regular rhythm.     Pulses: Normal pulses.     Heart sounds: Normal heart sounds. No murmur heard.    No friction rub. No gallop.  Pulmonary:      Effort: Pulmonary effort is normal.     Breath sounds: Normal breath sounds. No wheezing, rhonchi or rales.  Musculoskeletal:        General: Tenderness present.  Skin:    General: Skin is warm and dry.     Capillary Refill: Capillary refill takes less than 2 seconds.     Findings: No erythema or rash.  Neurological:     General: No focal deficit present.     Mental Status: He is alert and oriented to person, place, and time.  Psychiatric:        Mood and Affect: Mood normal.        Behavior: Behavior normal.        Thought Content: Thought content normal.        Judgment: Judgment normal.      UC Treatments / Results  Labs (all labs ordered are listed, but only abnormal results are displayed) Labs Reviewed - No data to display  EKG   Radiology No results found.  Procedures Procedures (including critical care time)  Medications Ordered in UC Medications - No data to display  Initial Impression / Assessment and Plan / UC Course  I have reviewed the triage vital signs and the nursing notes.  Pertinent labs & imaging results that were available during my care of the patient were reviewed by me and considered in my medical decision making (see chart for details).  Patient is a very pleasant, nontoxic-appearing 65-year-old male here for evaluation of right back pain has been gone for the past week as outlined HPI above.  On exam patient has normal axial carriage.  He has no midline spinous tenderness.  He does have tenderness and mild spasm in the right thoracic.  He spinous region.  Cardiopulmonary exam reveals S1-S2 heart sounds with regular rate and rhythm and lung sounds that are clear to auscultation all fields.  No CVA tenderness on exam.  Patient's exam is consistent with musculoskeletal back pain.  I will treat him with prednisone and baclofen since ibuprofen has not been helping.  I have also instructed him to use moist heat to help improve  blood flow to the muscle group  and aid in healing.  Home physical therapy exercises also provided for the patient.  Return precautions reviewed.   Final Clinical Impressions(s) / UC Diagnoses   Final diagnoses:  Thoracic myofascial strain, initial encounter     Discharge Instructions      Take the prednisone as directed. Start tomorrow morning. Take it each morning at breakfast.  Take the baclofen, 10 mg every 8 hours, on a schedule for the next 48 hours and then as needed.  Apply moist heat to your back for 30 minutes at a time 2-3 times a day to improve blood flow to the area and help remove the lactic acid causing the spasm.  Follow the back exercises given at discharge.  Return for reevaluation for any new or worsening symptoms.      ED Prescriptions     Medication Sig Dispense Auth. Provider   predniSONE (STERAPRED UNI-PAK 21 TAB) 10 MG (21) TBPK tablet Take 6 tablets on day 1, 5 tablets day 2, 4 tablets day 3, 3 tablets day 4, 2 tablets day 5, 1 tablet day 6 21 tablet Becky Augusta, NP   baclofen (LIORESAL) 10 MG tablet Take 1 tablet (10 mg total) by mouth 3 (three) times daily. 30 each Becky Augusta, NP      PDMP not reviewed this encounter.   Becky Augusta, NP 09/04/21 1939

## 2021-09-04 NOTE — ED Triage Notes (Signed)
Pt c/o lower back pain along the right side x1week  Pt states that it hurts when taking deep breaths, laying down, or getting out of his car.   Pt was seen a few weeks ago for his elbow and is worried if he overcompensated and hurt his right side.

## 2021-09-04 NOTE — Discharge Instructions (Signed)
Take the prednisone as directed. Start tomorrow morning. Take it each morning at breakfast.  Take the baclofen, 10 mg every 8 hours, on a schedule for the next 48 hours and then as needed.  Apply moist heat to your back for 30 minutes at a time 2-3 times a day to improve blood flow to the area and help remove the lactic acid causing the spasm.  Follow the back exercises given at discharge.  Return for reevaluation for any new or worsening symptoms.

## 2021-11-03 ENCOUNTER — Ambulatory Visit
Admission: EM | Admit: 2021-11-03 | Discharge: 2021-11-03 | Disposition: A | Discharge status: Home / Self Care | Payer: Managed Care, Other (non HMO) | Source: Home / Self Care

## 2021-11-03 ENCOUNTER — Emergency Department
Admission: EM | Admit: 2021-11-03 | Discharge: 2021-11-03 | Disposition: A | Discharge status: Home / Self Care | Payer: Managed Care, Other (non HMO) | Attending: Emergency Medicine | Admitting: Emergency Medicine

## 2021-11-03 ENCOUNTER — Other Ambulatory Visit: Payer: Self-pay

## 2021-11-03 ENCOUNTER — Ambulatory Visit (INDEPENDENT_AMBULATORY_CARE_PROVIDER_SITE_OTHER): Payer: Managed Care, Other (non HMO)

## 2021-11-03 ENCOUNTER — Encounter: Payer: Self-pay | Admitting: Emergency Medicine

## 2021-11-03 ENCOUNTER — Emergency Department: Payer: Managed Care, Other (non HMO)

## 2021-11-03 DIAGNOSIS — R109 Unspecified abdominal pain: Secondary | ICD-10-CM | POA: Insufficient documentation

## 2021-11-03 DIAGNOSIS — I10 Essential (primary) hypertension: Secondary | ICD-10-CM | POA: Diagnosis not present

## 2021-11-03 DIAGNOSIS — R059 Cough, unspecified: Secondary | ICD-10-CM | POA: Diagnosis not present

## 2021-11-03 DIAGNOSIS — X58XXXA Exposure to other specified factors, initial encounter: Secondary | ICD-10-CM | POA: Diagnosis not present

## 2021-11-03 DIAGNOSIS — S301XXA Contusion of abdominal wall, initial encounter: Secondary | ICD-10-CM | POA: Diagnosis not present

## 2021-11-03 DIAGNOSIS — T148XXA Other injury of unspecified body region, initial encounter: Secondary | ICD-10-CM

## 2021-11-03 DIAGNOSIS — R1012 Left upper quadrant pain: Secondary | ICD-10-CM | POA: Diagnosis not present

## 2021-11-03 DIAGNOSIS — S39011A Strain of muscle, fascia and tendon of abdomen, initial encounter: Secondary | ICD-10-CM | POA: Insufficient documentation

## 2021-11-03 LAB — CBC WITH DIFFERENTIAL/PLATELET
Abs Immature Granulocytes: 0.02 10*3/uL (ref 0.00–0.07)
Basophils Absolute: 0.1 10*3/uL (ref 0.0–0.1)
Basophils Relative: 1 %
Eosinophils Absolute: 0.2 10*3/uL (ref 0.0–0.5)
Eosinophils Relative: 3 %
HCT: 44 % (ref 39.0–52.0)
Hemoglobin: 15.4 g/dL (ref 13.0–17.0)
Immature Granulocytes: 0 %
Lymphocytes Relative: 42 %
Lymphs Abs: 3.1 10*3/uL (ref 0.7–4.0)
MCH: 33.3 pg (ref 26.0–34.0)
MCHC: 35 g/dL (ref 30.0–36.0)
MCV: 95.2 fL (ref 80.0–100.0)
Monocytes Absolute: 0.7 10*3/uL (ref 0.1–1.0)
Monocytes Relative: 9 %
Neutro Abs: 3.3 10*3/uL (ref 1.7–7.7)
Neutrophils Relative %: 45 %
Platelets: 225 10*3/uL (ref 150–400)
RBC: 4.62 MIL/uL (ref 4.22–5.81)
RDW: 13.2 % (ref 11.5–15.5)
WBC: 7.4 10*3/uL (ref 4.0–10.5)
nRBC: 0 % (ref 0.0–0.2)

## 2021-11-03 LAB — SEDIMENTATION RATE: Sed Rate: 4 mm/hr (ref 0–20)

## 2021-11-03 LAB — COMPREHENSIVE METABOLIC PANEL
ALT: 39 U/L (ref 0–44)
AST: 29 U/L (ref 15–41)
Albumin: 4 g/dL (ref 3.5–5.0)
Alkaline Phosphatase: 57 U/L (ref 38–126)
Anion gap: 6 (ref 5–15)
BUN: 18 mg/dL (ref 6–20)
CO2: 25 mmol/L (ref 22–32)
Calcium: 8.9 mg/dL (ref 8.9–10.3)
Chloride: 109 mmol/L (ref 98–111)
Creatinine, Ser: 0.82 mg/dL (ref 0.61–1.24)
GFR, Estimated: >60 mL/min (ref >60)
Glucose, Bld: 119 mg/dL — ABNORMAL HIGH (ref 70–99)
Potassium: 4.1 mmol/L (ref 3.5–5.1)
Sodium: 140 mmol/L (ref 135–145)
Total Bilirubin: 1 mg/dL (ref 0.3–1.2)
Total Protein: 7.9 g/dL (ref 6.5–8.1)

## 2021-11-03 LAB — URINALYSIS, ROUTINE W REFLEX MICROSCOPIC
Bilirubin Urine: NEGATIVE
Glucose, UA: NEGATIVE mg/dL
Ketones, ur: NEGATIVE mg/dL
Leukocytes,Ua: NEGATIVE
Nitrite: NEGATIVE
Protein, ur: NEGATIVE mg/dL
Specific Gravity, Urine: 1.025 (ref 1.005–1.030)
pH: 6 (ref 5.0–8.0)

## 2021-11-03 LAB — URINALYSIS, MICROSCOPIC (REFLEX)

## 2021-11-03 MED ORDER — IOHEXOL 300 MG/ML  SOLN
100.0000 mL | Freq: Once | INTRAMUSCULAR | Status: AC | PRN
  Administered 2021-11-03: 100 mL via INTRAVENOUS

## 2021-11-03 NOTE — ED Notes (Signed)
Patient is being discharged from the Urgent Care and sent to the [facility] Emergency Department via private vehicle with wife . Per Becky Augusta, NP, patient is in need of higher level of care due to foreign body in abdomen and severe abdominal pain. Patient is aware and verbalizes understanding of plan of care.  Vitals:   11/03/21 1056  BP: (!) 129/96  Pulse: 84  Resp: 15  Temp: 98.1 F (36.7 C)  SpO2: 98%

## 2021-11-03 NOTE — Discharge Instructions (Addendum)
I suspect that your abdominal pain is related to muscle strain from coughing.  You can take Tylenol and Motrin for pain.  Your CAT scan did not show any acute abnormality.

## 2021-11-03 NOTE — ED Provider Notes (Signed)
[facility]    CSN: [id number] Arrival date & time: 11/03/21  1045      History   Chief Complaint Chief Complaint  Patient presents with   Cough   Abdominal Pain    HPI Ernest Tucker is a 61 y.o. male.   HPI  61 year old male here for evaluation of respiratory and abdominal complaints.  Patient reports that for the last week he has been experiencing nasal congestion, and a cough that is productive for scant amount of mucus.  He states that his cough and upper respiratory congestion have largely resolved.  He has had fatigue.  His greater complaint is pain in the left side of his abdomen that started 4 to 5 days ago.  He and his wife both noticed a bruise to his lower abdomen last night.  Additionally patient has been having some body aches.  He denies any fever, sore throat, ear pain, shortness breath, wheezing, nausea, vomiting, diarrhea, pain with urination, urinary urgency frequency, or rashes.  He states his last normal bowel movement was this morning without difficulty or abnormality.  Past Medical History:  Diagnosis Date   Hypertension    Kidney stones     Patient Active Problem List   Diagnosis Date Noted   Abscess of back 10/13/2020   Chest pain with moderate risk for cardiac etiology 01/13/2018   Leg edema 01/11/2018   Hyperlipidemia 01/11/2018   Prediabetes 01/11/2018   Essential hypertension 01/11/2018    Past Surgical History:  Procedure Laterality Date   NO PAST SURGERIES         Home Medications    Prior to Admission medications   Medication Sig Start Date End Date Taking? Authorizing Provider  aspirin EC 81 MG tablet Take 81 mg by mouth daily.   Yes [provider]  atorvastatin (LIPITOR) 20 MG tablet Take 20 mg by mouth daily. 10/24/21  Yes [provider]  citalopram (CELEXA) 20 MG tablet Take 20 mg by mouth daily. 09/19/20  Yes [provider]  lisinopril (ZESTRIL) 10 MG tablet Take 1 tablet (10 mg total)  by mouth daily. 10/15/20 11/03/21 Yes Lynn Ito, MD  Multiple Vitamin (MULTIVITAMIN) tablet Take 1 tablet by mouth daily.   Yes [provider]  baclofen (LIORESAL) 10 MG tablet Take 1 tablet (10 mg total) by mouth 3 (three) times daily. 09/04/21   Becky Augusta, NP  predniSONE (STERAPRED UNI-PAK 21 TAB) 10 MG (21) TBPK tablet Take 6 tablets on day 1, 5 tablets day 2, 4 tablets day 3, 3 tablets day 4, 2 tablets day 5, 1 tablet day 6 09/04/21   Becky Augusta, NP    Family History Family History  Problem Relation Age of Onset   COPD Mother    Heart attack Father     Social History Social History   Tobacco Use   Smoking status: Never   Smokeless tobacco: Never  Vaping Use   Vaping Use: Never used  Substance Use Topics   Alcohol use: Yes    Alcohol/week: 3.0 standard drinks of alcohol    Types: 3 Cans of beer per week    Comment: on the weekends   Drug use: No     Allergies   Other   Review of Systems Review of Systems  Constitutional:  Positive for fatigue. Negative for fever.  HENT:  Positive for congestion and rhinorrhea. Negative for ear pain and sore throat.   Respiratory:  Positive for cough. Negative for shortness of  breath and wheezing.   Gastrointestinal:  Negative for constipation, diarrhea, nausea and vomiting.  Genitourinary:  Negative for dysuria, frequency and urgency.  Skin:  Negative for rash.     Physical Exam Triage Vital Signs ED Triage Vitals  Enc Vitals Group     BP 11/03/21 1056 (!) 129/96     Pulse Rate 11/03/21 1056 84     Resp 11/03/21 1056 15     Temp 11/03/21 1056 98.1 F (36.7 C)     Temp Source 11/03/21 1056 Oral     SpO2 11/03/21 1056 98 %     Weight 11/03/21 1052 260 lb (117.9 kg)     Height 11/03/21 1052 5\' 9"  (1.753 m)     Head Circumference --      Peak Flow --      Pain Score 11/03/21 1052 7     Pain Loc --      Pain Edu? --      Excl. in [place]? --    No data found.  Updated Vital Signs BP (!) 129/96 (BP Location:  Left Arm)   Pulse 84   Temp 98.1 F (36.7 C) (Oral)   Resp 15   Ht 5\' 9"  (1.753 m)   Wt 260 lb (117.9 kg)   SpO2 98%   BMI 38.40 kg/m   Visual Acuity Right Eye Distance:   Left Eye Distance:   Bilateral Distance:    Right Eye Near:   Left Eye Near:    Bilateral Near:     Physical Exam Vitals and nursing note reviewed.  Constitutional:      General: He is in acute distress.     Appearance: Normal appearance.  HENT:     Head: Normocephalic and atraumatic.     Right Ear: Tympanic membrane, ear canal and external ear normal. There is no impacted cerumen.     Left Ear: Tympanic membrane, ear canal and external ear normal. There is no impacted cerumen.     Nose: Congestion and rhinorrhea present.     Mouth/Throat:     Mouth: Mucous membranes are moist.     Pharynx: Oropharynx is clear. Posterior oropharyngeal erythema present. No oropharyngeal exudate.  Cardiovascular:     Rate and Rhythm: Normal rate and regular rhythm.     Pulses: Normal pulses.     Heart sounds: Normal heart sounds. No murmur heard.    No friction rub. No gallop.  Pulmonary:     Effort: Pulmonary effort is normal.     Breath sounds: Normal breath sounds. No wheezing, rhonchi or rales.  Abdominal:     General: There is distension.     Tenderness: There is abdominal tenderness. There is no right CVA tenderness, left CVA tenderness, guarding or rebound.  Musculoskeletal:     Cervical back: Normal range of motion and neck supple.  Lymphadenopathy:     Cervical: No cervical adenopathy.  Skin:    General: Skin is warm.     Capillary Refill: Capillary refill takes less than 2 seconds.     Findings: Bruising present.  Neurological:     General: No focal deficit present.     Mental Status: He is alert. Mental status is at baseline. He is disoriented.  Psychiatric:        Mood and Affect: Mood normal.        Behavior: Behavior normal.        Thought Content: Thought content normal.        Judgment:  Judgment normal.      UC Treatments / Results  Labs (all labs ordered are listed, but only abnormal results are displayed) Labs Reviewed  COMPREHENSIVE METABOLIC PANEL - Abnormal; Notable for the following components:      Result Value   Glucose, Bld 119 (*)    All other components within normal limits  URINALYSIS, ROUTINE W REFLEX MICROSCOPIC - Abnormal; Notable for the following components:   Hgb urine dipstick TRACE (*)    All other components within normal limits  URINALYSIS, MICROSCOPIC (REFLEX) - Abnormal; Notable for the following components:   Bacteria, UA FEW (*)    All other components within normal limits  CBC WITH DIFFERENTIAL/PLATELET  SEDIMENTATION RATE    EKG   Radiology DG Abd 2 Views  Result Date: 11/03/2021 CLINICAL DATA:  Left upper quadrant pain 4-5 days. EXAM: ABDOMEN - 2 VIEW COMPARISON:  CT abdomen/pelvis 10/13/2020 FINDINGS: Bowel gas pattern is nonobstructive. There is mild fecal retention throughout the colon. No free peritoneal air. 2 adjacent thin linear radiodensities over the upper abdomen just right of midline unchanged from the previous CT scan projecting over the region of the distal stomach as well as pancreatic head of uncertain clinical significance as findings suggest a chronic foreign body. Remainder of the exam unchanged. IMPRESSION: 1. Nonobstructive bowel gas pattern with mild fecal retention throughout the colon. 2. Chronic radiopaque foreign body over the upper abdomen as described. Electronically Signed   By: Elberta Fortis M.D.   On: 11/03/2021 11:49   DG Chest 2 View  Result Date: 11/03/2021 CLINICAL DATA:  Cough and congestion 1 week. Left upper quadrant abdominal pain. EXAM: CHEST - 2 VIEW COMPARISON:  None Available. FINDINGS: Lungs are adequately inflated without focal airspace consolidation or effusion. Cardiomediastinal silhouette is normal. Degenerative change/DISH of the spine. IMPRESSION: No active cardiopulmonary disease.  Electronically Signed   By: Elberta Fortis M.D.   On: 11/03/2021 11:44    Procedures Procedures (including critical care time)  Medications Ordered in UC Medications - No data to display  Initial Impression / Assessment and Plan / UC Course  I have reviewed the triage vital signs and the nursing notes.  Pertinent labs & imaging results that were available during my care of the patient were reviewed by me and considered in my medical decision making (see chart for details).   Patient is a 61 year old male who appears to be in a moderate degree of discomfort presenting for evaluation of respiratory symptoms which have largely resolved at been present for last week, and abdominal pain which has been present for last 4 to 5 days.  The abdominal pain is in the left upper quadrant of the abdomen and the patient also has a bruise in the lower aspect of the abdomen on the left that wraps around to the left flank.  He denies any injury.  He does hold onto the left upper quadrant when he moves as it causes pain.  Is also complaining of pain in his left shoulder.  He has been using IcyHot on both of these locations.  He does endorse some mild residual nasal congestion but no nasal discharge.  He also reports a cough with scant, infrequent mucus production.  No shortness of breath or wheezing.  His physical exam reveals pearly-gray tympanic membranes bilaterally with normal light reflex and clear external auditory canals.  Nasal mucosa is mildly edematous but without erythema.  There is scant clear discharge in both nares.  Oropharyngeal exam reveals very  mild posterior oropharyngeal erythema with clear postnasal drip.  No anterior cervical lymphadenopathy on exam.  Cardiopulmonary exam reveals S1-S2 heart sounds with regular rate and rhythm and lung sounds that are clear to auscultation all fields.  Patient's abdomen is protuberant with marked tenderness to the left upper and lower quadrant of the abdomen but no  guarding or rebound.  Epigastrium, right upper quadrant, suprapubic area, and right lower quadrant are all negative to pain with palpation.  No guarding or rebound.  Patient does have a significant contusion to the lower aspect of his abdomen.    This contusion extends back around to the left flank.  It is tender to touch.  Patient denies any injury.  He also denies any rashes.  I do not feel any palpable muscle wall defect in the abdomen but the patient does have marked tenderness.  I will check CBC, CMP, sed rate, UA, chest x-ray, and abdominal x-ray.  CBC shows a normal white count of 7.4, normal RBC count of 4.62, and normal H&H of 15.4 and 44.0 respectively.  Patient's platelets are normal at 225.  No abnormalities in the differential.  CMP shows a mildly elevated glucose of 119 but otherwise electrolytes, renal function, and transaminases are unremarkable.  Chest x-ray independently reviewed and evaluated by me.  Impression: Lungs are well inflated and the costophrenic angles are crisp.  There is no evidence of effusion or infiltrate.  Radiology overread is pending. Radiology report states no active cardiopulmonary disease.  Radiology impression of abdomen film said there is a nonobstructive bowel gas pattern with mild fecal retention throughout the colon.  Chronic radiopaque foreign body over the upper abdomen as described.  This is described as 2 adjacent thin linear radiolucent densities of the upper abdomen just right of midline unchanged from previous CT scan projecting over the region of the distal stomach as well as pancreatic head of uncertain clinical significance.  Sedimentation rate is 4.  Urinalysis shows trace hemoglobin but is negative for leukocyte esterase, protein, or nitrites.  Specific gravity is normal at 1.025.  Reflex microscopy shows few bacteria but is otherwise unremarkable.  The etiology of the patient's pain and bruising is unclear.  His blood work and urinalysis  are all very reassuring however I do feel he needs a scan of his abdomen to rule out any ominous pathology.  I will refer him to the emergency department.  Patient is elected to go to [facility] ER.  I feel he is safe to travel via POV and he will go by car.   Final Clinical Impressions(s) / UC Diagnoses   Final diagnoses:  Abdominal pain, unspecified abdominal location     Discharge Instructions      Please go to the emergency department at [facility] for further evaluation of your abdominal pain and abdominal bruising.     ED Prescriptions   None    PDMP not reviewed this encounter.   Becky Augusta, NP 11/03/21 1227

## 2021-11-03 NOTE — ED Triage Notes (Signed)
PT here from [facility] with abd pain. Pt states he his hurting in his LLQ region. Pt states the pain is becoming unbearable,  urgent care sent pt here for scan. Labs and urine collected at [facility].

## 2021-11-03 NOTE — Discharge Instructions (Addendum)
Please go to the emergency department at [facility] for further evaluation of your abdominal pain and abdominal bruising.

## 2021-11-03 NOTE — ED Provider Notes (Signed)
[facility] Provider Note    Event Date/Time   First MD Initiated Contact with Patient 11/03/21 1408     (approximate)   History   Abdominal Pain   HPI  Ernest Tucker is a 61 y.o. male past medical history of hypertension who presents with abdominal pain.  Patient has had cough for the last 2 weeks.  He started having some left lower quadrant pain after coughing.  Last night noticed a bruise in the abdomen spanning over to the left flank.  Denies hitting abdomen or any trauma.  His pain is only with movement or with coughing.  Denies vomiting diarrhea or constipation.  No other bruising.  No bleeding from the gums blood in the stool blood in his urine.  No fevers or chills.  Denies shortness of breath.  Patient was seen at urgent care and had labs that were all reassuring.  He was sent to the ED for a CT of the abdomen pelvis.     Past Medical History:  Diagnosis Date   Hypertension    Kidney stones     Patient Active Problem List   Diagnosis Date Noted   Abscess of back 10/13/2020   Chest pain with moderate risk for cardiac etiology 01/13/2018   Leg edema 01/11/2018   Hyperlipidemia 01/11/2018   Prediabetes 01/11/2018   Essential hypertension 01/11/2018     Physical Exam  Triage Vital Signs: ED Triage Vitals  Enc Vitals Group     BP 11/03/21 1254 (!) 147/100     Pulse Rate 11/03/21 1254 83     Resp 11/03/21 1254 20     Temp 11/03/21 1254 98.5 F (36.9 C)     Temp Source 11/03/21 1254 Oral     SpO2 11/03/21 1254 94 %     Weight 11/03/21 1255 259 lb 14.8 oz (117.9 kg)     Height 11/03/21 1255 5\' 9"  (1.753 m)     Head Circumference --      Peak Flow --      Pain Score 11/03/21 1255 8     Pain Loc --      Pain Edu? --      Excl. in [place]? --     Most recent vital signs: Vitals:   11/03/21 1610 11/03/21 1610  BP:  (!) 134/98  Pulse:  75  Resp: 16 16  Temp:  98.3 F (36.8 C)  SpO2:  96%     General: Awake, no distress.  CV:  Good  peripheral perfusion.  Resp:  Normal effort.  Abd:  No distention.  Abdomen is mildly tender in the left lower quadrant there is ecchymosis extending from the umbilicus to the left lower quadrant and left flank Neuro:             Awake, Alert, Oriented x 3  Other:     ED Results / Procedures / Treatments  Labs (all labs ordered are listed, but only abnormal results are displayed) Labs Reviewed - No data to display   EKG     RADIOLOGY  CT abdomen pelvis reviewed and interpreted myself is negative for acute abnormality  PROCEDURES:  Critical Care performed: No  Procedures    MEDICATIONS ORDERED IN ED: Medications  iohexol (OMNIPAQUE) 300 MG/ML solution 100 mL (100 mLs Intravenous Contrast Given 11/03/21 1541)     IMPRESSION / MDM / ASSESSMENT AND PLAN / ED COURSE  I reviewed the triage vital signs and the nursing notes.  Patient's presentation is most consistent with acute presentation with potential threat to life or bodily function.  Differential diagnosis includes, but is not limited to, abdominal muscle strain, intra-abdominal hemorrhage, perforated viscus  Patient is a 61 year old male presenting with left lower quad abdominal pain.  This started after coughing and pain is worse with coughing and with movement.  He noticed a bruise on his abdomen last night denies any preceding trauma.  He was seen at urgent care and they were concerned about the ecchymosis so referred to the ED for CT of the abdomen pelvis.  Patient is hemodynamically stable and looks well.  He does have a fairly large bruise spanning the left lower abdomen to the left flank that looks more than a day old to me.  He did say he was putting IcyHot on the area so question whether this could have been the cause.  Overall I think his presentation is most consistent with a muscle strain but given the degree of ecchymosis will obtain a CT abdomen pelvis.  CT abdomen pelvis is  without acute abnormality.  Suspect muscle strain.  Patient is appropriate for discharge.  FINAL CLINICAL IMPRESSION(S) / ED DIAGNOSES   Final diagnoses:  Muscle strain     Rx / DC Orders   ED Discharge Orders     None        Note:  This document was prepared using Dragon voice recognition software and may include unintentional dictation errors.   Georga Hacking, MD 11/03/21 [phone]

## 2021-11-03 NOTE — ED Triage Notes (Signed)
Patient c/o cough and chest congestion for over a week.  Patient c/o LUQ abdominal that started 4-5 days ago.  Patient denies injury or fall.  Patient states that he has a bruise in the area.  Patient denies N/V/D.

## 2022-03-15 ENCOUNTER — Ambulatory Visit (INDEPENDENT_AMBULATORY_CARE_PROVIDER_SITE_OTHER): Payer: Managed Care, Other (non HMO)

## 2022-03-15 ENCOUNTER — Ambulatory Visit
Admission: EM | Admit: 2022-03-15 | Discharge: 2022-03-15 | Disposition: A | Discharge status: Home / Self Care | Payer: Managed Care, Other (non HMO) | Attending: Emergency Medicine | Admitting: Emergency Medicine

## 2022-03-15 ENCOUNTER — Encounter: Payer: Self-pay | Admitting: Emergency Medicine

## 2022-03-15 DIAGNOSIS — S50812A Abrasion of left forearm, initial encounter: Secondary | ICD-10-CM

## 2022-03-15 DIAGNOSIS — R0781 Pleurodynia: Secondary | ICD-10-CM

## 2022-03-15 MED ORDER — PREDNISONE 10 MG (21) PO TBPK: ORAL_TABLET | ORAL | 0 refills | Status: AC

## 2022-03-15 MED ORDER — KETOROLAC TROMETHAMINE 30 MG/ML IJ SOLN
30.0000 mg | Freq: Once | INTRAMUSCULAR | Status: AC
  Administered 2022-03-15: 30 mg via INTRAMUSCULAR

## 2022-03-15 NOTE — ED Triage Notes (Signed)
Pt states he slipped on ice 2 days ago. He has abrasion on his left arm and is having rib pain. He states it hurts to take a deep breath.

## 2022-03-15 NOTE — Discharge Instructions (Signed)
X-rays negative for injury to the bone and pain is most likely coming from irritation to the soft tissues, should improve with time  You have been given an injection of Toradol today here in the office to help reduce inflammation and help with pain, you may use Tylenol for the remainder of the day  Starting tomorrow take prednisone every morning as directed to continue the above process, you may take Tylenol throughout the day during use  You may use ice or heat over the affected area  You have been wrapped with compression here in the office to provide stability and support, may use at home as needed  For any concerns regarding the rib you may follow-up with urgent care or your primary doctor as needed

## 2022-03-15 NOTE — ED Provider Notes (Signed)
[facility]    CSN: [id number] Arrival date & time: 03/15/22  1522      History   Chief Complaint Chief Complaint  Patient presents with   Fall   Rib Injury    HPI Ernest Tucker is a 62 y.o. male.   Patient presents for evaluation of left-sided rib pain beginning 2 days ago after fall.  Endorses that he slipped on black ice landing directly onto the left forearm which has abrasion with bruising present.  Has full range of motion of the arm.  Tenderness only experienced touched.  Pain to the left side is experienced with deep breathing and with all range of motion.  Has attempted use of ibuprofen which has been ineffective.  Denies shortness of breath.   Past Medical History:  Diagnosis Date   Hypertension    Kidney stones     Patient Active Problem List   Diagnosis Date Noted   Abscess of back 10/13/2020   Chest pain with moderate risk for cardiac etiology 01/13/2018   Leg edema 01/11/2018   Hyperlipidemia 01/11/2018   Prediabetes 01/11/2018   Essential hypertension 01/11/2018    Past Surgical History:  Procedure Laterality Date   NO PAST SURGERIES         Home Medications    Prior to Admission medications   Medication Sig Start Date End Date Taking? Authorizing Provider  aspirin EC 81 MG tablet Take 81 mg by mouth daily.   Yes [provider]  atorvastatin (LIPITOR) 20 MG tablet Take 20 mg by mouth daily. 10/24/21  Yes [provider]  citalopram (CELEXA) 20 MG tablet Take 20 mg by mouth daily. 09/19/20  Yes [provider]  lisinopril (ZESTRIL) 10 MG tablet Take 1 tablet (10 mg total) by mouth daily. 10/15/20 03/15/22 Yes Nolberto Hanlon, MD  Multiple Vitamin (MULTIVITAMIN) tablet Take 1 tablet by mouth daily.   Yes [provider]  baclofen (LIORESAL) 10 MG tablet Take 1 tablet (10 mg total) by mouth 3 (three) times daily. 09/04/21   Margarette Canada, NP  predniSONE (STERAPRED UNI-PAK 21 TAB) 10 MG (21) TBPK tablet Take 6  tablets on day 1, 5 tablets day 2, 4 tablets day 3, 3 tablets day 4, 2 tablets day 5, 1 tablet day 6 09/04/21   Margarette Canada, NP    Family History Family History  Problem Relation Age of Onset   COPD Mother    Heart attack Father     Social History Social History   Tobacco Use   Smoking status: Never   Smokeless tobacco: Never  Vaping Use   Vaping Use: Never used  Substance Use Topics   Alcohol use: Yes    Alcohol/week: 3.0 standard drinks of alcohol    Types: 3 Cans of beer per week    Comment: on the weekends   Drug use: No     Allergies   Other   Review of Systems Review of Systems   Physical Exam Triage Vital Signs ED Triage Vitals  Enc Vitals Group     BP 03/15/22 1542 124/85     Pulse Rate 03/15/22 1542 90     Resp 03/15/22 1542 16     Temp 03/15/22 1542 97.9 F (36.6 C)     Temp Source 03/15/22 1542 Oral     SpO2 03/15/22 1542 96 %     Weight 03/15/22 1541 259 lb 14.8 oz (117.9 kg)     Height 03/15/22 1541 5\' 9"  (1.753  m)     Head Circumference --      Peak Flow --      Pain Score 03/15/22 1539 9     Pain Loc --      Pain Edu? --      Excl. in [place]? --    No data found.  Updated Vital Signs BP 124/85 (BP Location: Right Arm)   Pulse 90   Temp 97.9 F (36.6 C) (Oral)   Resp 16   Ht 5\' 9"  (1.753 m)   Wt 259 lb 14.8 oz (117.9 kg)   SpO2 96%   BMI 38.38 kg/m   Visual Acuity Right Eye Distance:   Left Eye Distance:   Bilateral Distance:    Right Eye Near:   Left Eye Near:    Bilateral Near:     Physical Exam Constitutional:      Appearance: Normal appearance.  Cardiovascular:     Rate and Rhythm: Normal rate and regular rhythm.     Pulses: Normal pulses.     Heart sounds: Normal heart sounds.  Pulmonary:     Effort: Pulmonary effort is normal.     Breath sounds: Normal breath sounds.  Chest:       Comments: Point tenderness present over ribs 4 and 5 at the left chest wall, no ecchymosis or deformity noted, Chest wall is  symmetrical,  Skin:    Comments: 2 x 3 cm abrasion present to the posterior of the left forearm with ecchymosis, no drainage noted, 2+ brachial pulse, range of motion of the left upper extremity intact  Neurological:     Mental Status: He is alert.      UC Treatments / Results  Labs (all labs ordered are listed, but only abnormal results are displayed) Labs Reviewed - No data to display  EKG   Radiology No results found.  Procedures Procedures (including critical care time)  Medications Ordered in UC Medications - No data to display  Initial Impression / Assessment and Plan / UC Course  I have reviewed the triage vital signs and the nursing notes.  Pertinent labs & imaging results that were available during my care of the patient were reviewed by me and considered in my medical decision making (see chart for details).  Left arm abrasion, initial encounter, rib pain of left side  Vital signs are stable, O2 saturation 96% on room air, lungs clear to auscultation, low suspicion for respiratory involvement, x-ray of the left ribs are negative, discussed findings with patient, applied compression wrap to be used as needed for stability and support, Toradol injection given in office and prescribed prednisone taper for outpatient use, recommended RICE with follow-up as needed with PCP or urgent care Final Clinical Impressions(s) / UC Diagnoses   Final diagnoses:  None   Discharge Instructions   None    ED Prescriptions   None    PDMP not reviewed this encounter.   Hans Eden, NP 03/15/22 1630

## 2023-03-18 ENCOUNTER — Other Ambulatory Visit: Payer: Self-pay | Admitting: Family Medicine

## 2023-03-18 DIAGNOSIS — K760 Fatty (change of) liver, not elsewhere classified: Secondary | ICD-10-CM

## 2023-03-25 ENCOUNTER — Ambulatory Visit: Payer: Managed Care, Other (non HMO)

## 2023-03-28 ENCOUNTER — Ambulatory Visit
Admission: RE | Admit: 2023-03-28 | Discharge: 2023-03-28 | Disposition: A | Discharge status: Home / Self Care | Payer: Managed Care, Other (non HMO) | Source: Ambulatory Visit | Attending: Family Medicine | Admitting: Family Medicine

## 2023-03-28 DIAGNOSIS — K760 Fatty (change of) liver, not elsewhere classified: Secondary | ICD-10-CM | POA: Diagnosis present

## 2023-09-24 IMAGING — CR DG ELBOW COMPLETE 3+V*L*
4 series · 4 of 4 positions shown · non-contrast
Comparison: None Available.

CLINICAL DATA: Acute LEFT elbow pain for 3 days. No known injury.
Initial encounter.

EXAM:
LEFT ELBOW - COMPLETE 3+ VIEW

[elbow ap]
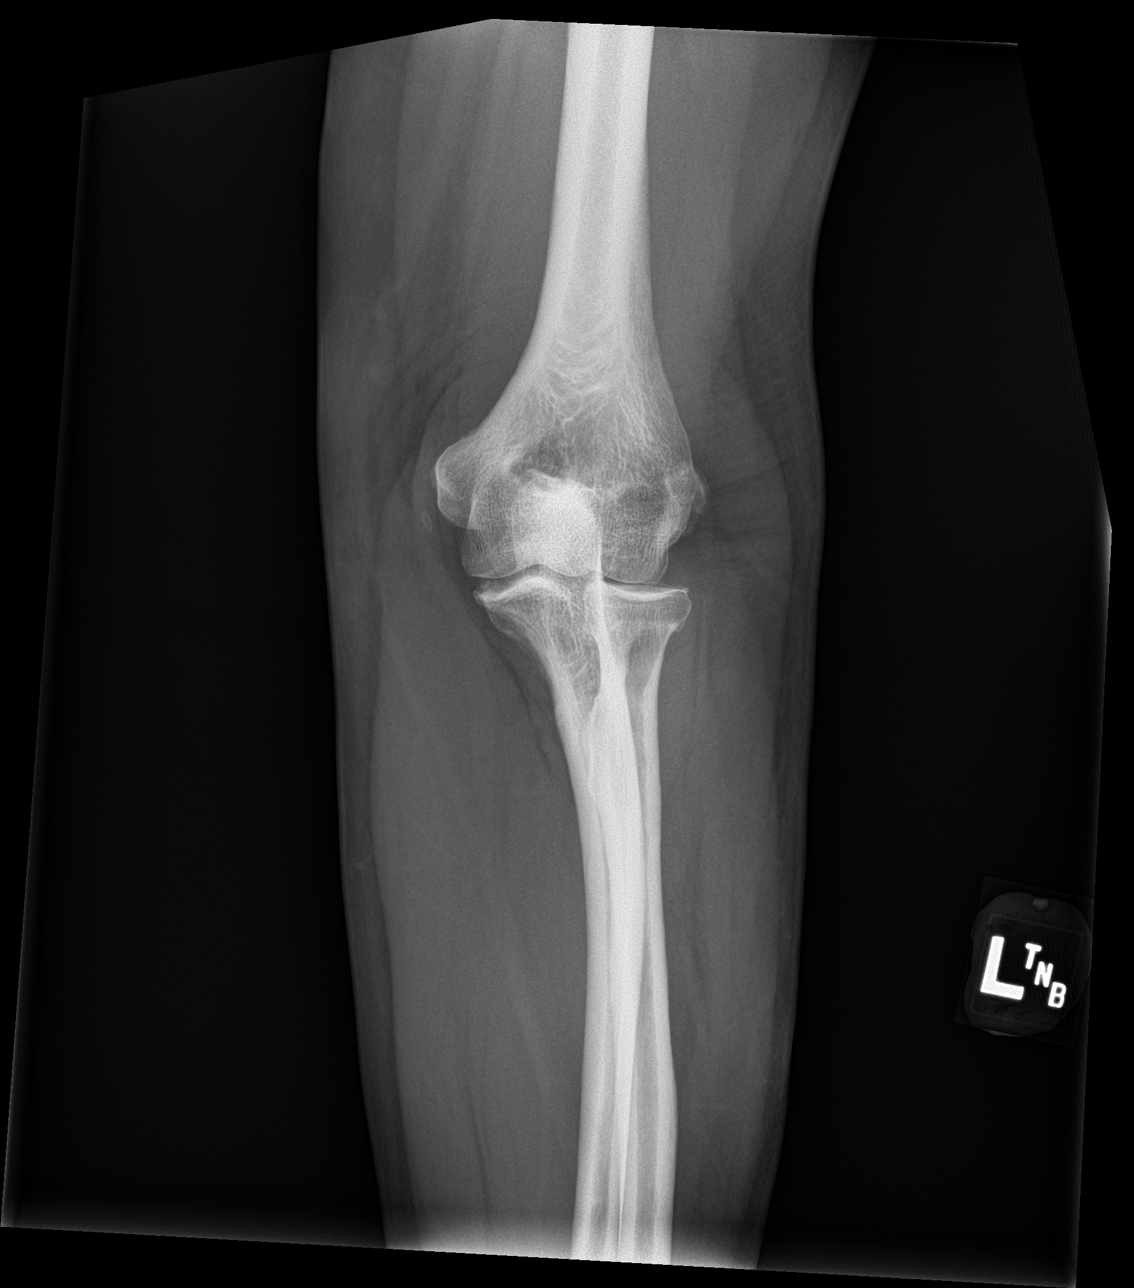

[elbow obl (1 of 2)]
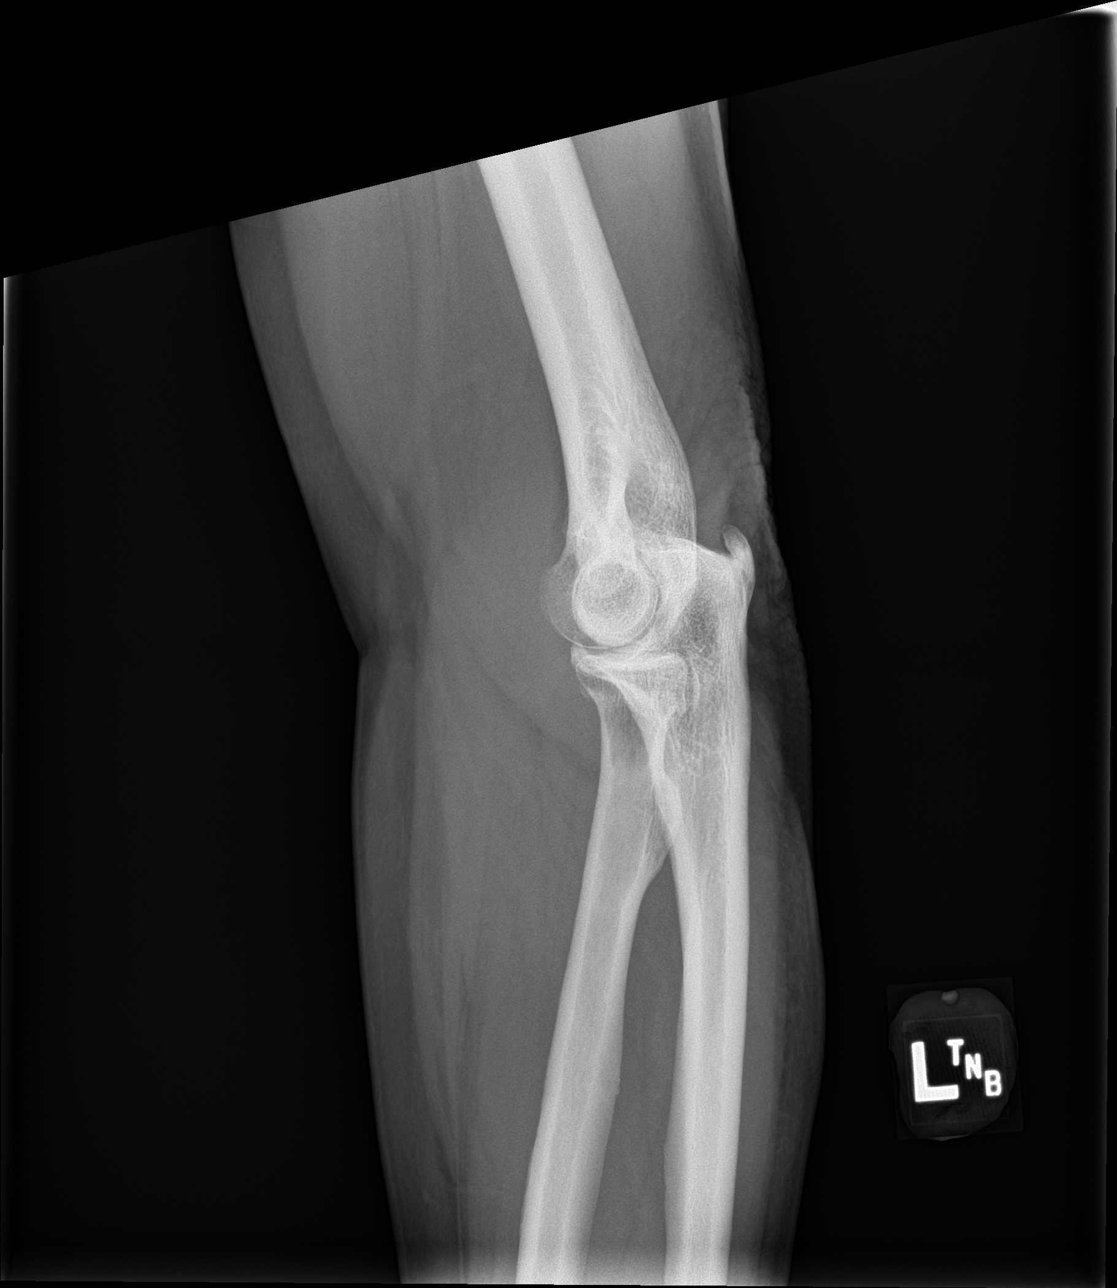

[elbow lat]
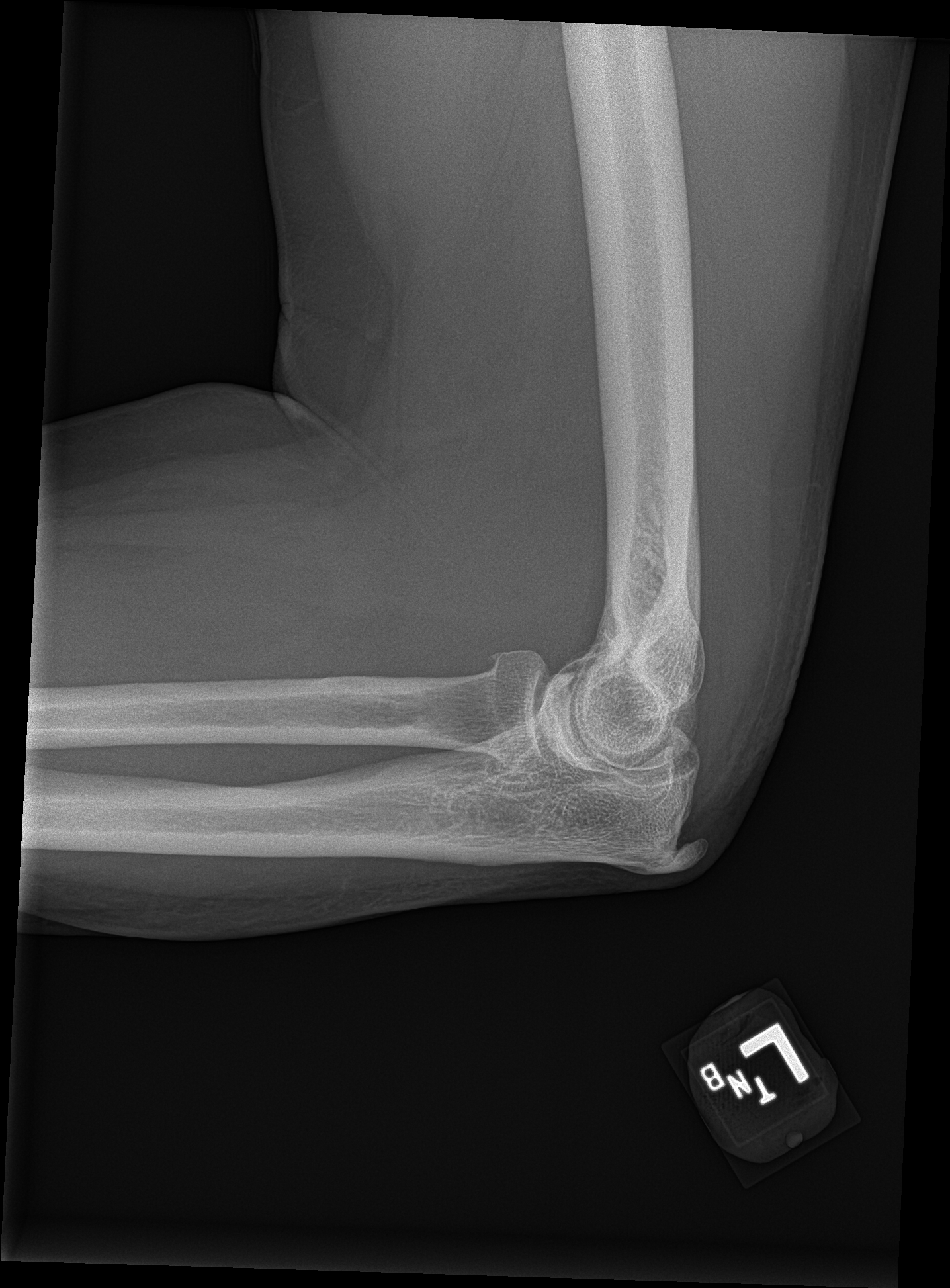

[elbow obl (2 of 2)]
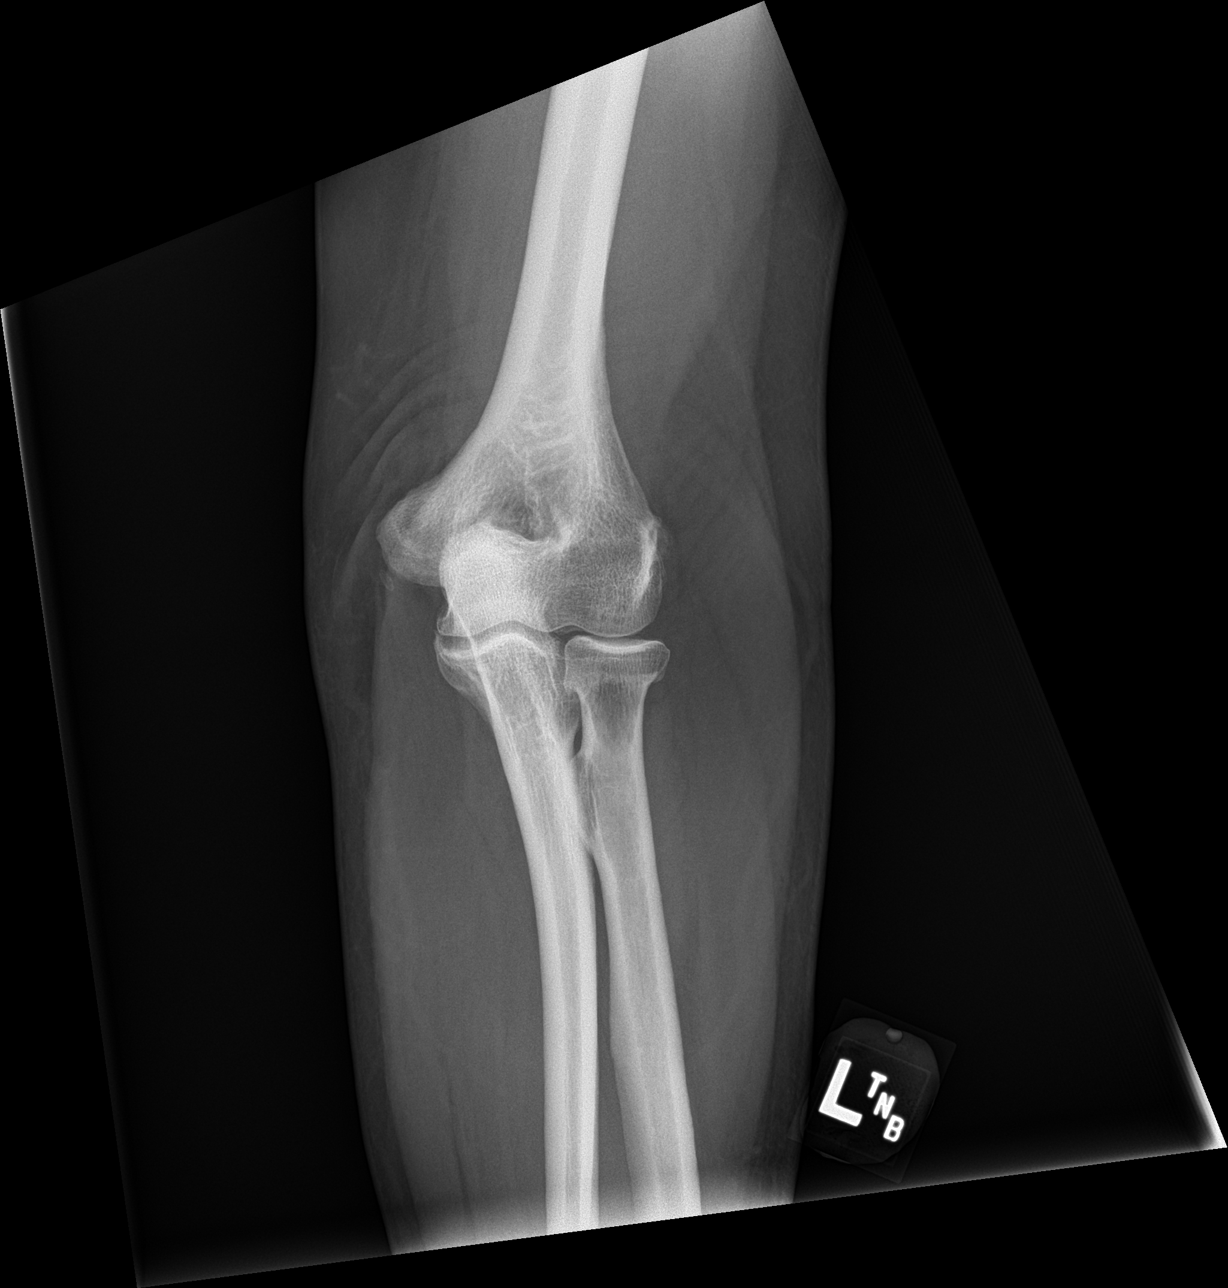

[4 of 4 positions shown; findings below may reference images not displayed]

FINDINGS: There is no evidence of acute fracture subluxation or dislocation.

No joint effusion is noted.

Olecranon spur is noted without overlying soft tissue swelling.

Mild degenerative changes at the humeral ulnar joint noted.

No other focal bony abnormalities are present.
IMPRESSION: 1. No acute abnormality.
2. Olecranon spur without overlying soft tissue swelling.
3. Mild degenerative changes at the humeral ulnar joint.
